# Patient Record
Sex: Female | Born: 1995 | Race: White | Hispanic: No | Marital: Married | Smoking: Never smoker
Health system: Southern US, Community
[De-identification: ages and names within clinical notes are randomized; demographics above are authoritative.]

## PROBLEM LIST (undated history)

## (undated) DIAGNOSIS — F419 Anxiety disorder, unspecified: Secondary | ICD-10-CM

## (undated) DIAGNOSIS — F429 Obsessive-compulsive disorder, unspecified: Secondary | ICD-10-CM

## (undated) DIAGNOSIS — K219 Gastro-esophageal reflux disease without esophagitis: Secondary | ICD-10-CM

## (undated) HISTORY — PX: WISDOM TOOTH EXTRACTION: SHX21

---
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
## 2017-08-31 ENCOUNTER — Encounter: Payer: Self-pay | Admitting: Family Medicine

## 2017-08-31 ENCOUNTER — Ambulatory Visit (INDEPENDENT_AMBULATORY_CARE_PROVIDER_SITE_OTHER): Payer: BLUE CROSS/BLUE SHIELD | Admitting: Family Medicine

## 2017-08-31 VITALS — BP 130/82 | HR 123 | Temp 98.5°F | Ht 61.0 in | Wt 209.8 lb

## 2017-08-31 DIAGNOSIS — F419 Anxiety disorder, unspecified: Secondary | ICD-10-CM | POA: Diagnosis not present

## 2017-08-31 DIAGNOSIS — F329 Major depressive disorder, single episode, unspecified: Secondary | ICD-10-CM | POA: Diagnosis not present

## 2017-08-31 DIAGNOSIS — Z7689 Persons encountering health services in other specified circumstances: Secondary | ICD-10-CM

## 2017-08-31 DIAGNOSIS — Z30011 Encounter for initial prescription of contraceptive pills: Secondary | ICD-10-CM | POA: Diagnosis not present

## 2017-08-31 DIAGNOSIS — F32A Depression, unspecified: Secondary | ICD-10-CM

## 2017-08-31 LAB — PREGNANCY, URINE: PREG TEST UR: NEGATIVE

## 2017-08-31 MED ORDER — NORGESTIMATE-ETH ESTRADIOL 0.25-35 MG-MCG PO TABS: 1.0000 | ORAL_TABLET | Freq: Every day | ORAL | 11 refills | Status: DC

## 2017-08-31 MED ORDER — FLUOXETINE HCL 10 MG PO CAPS: 10.0000 mg | ORAL_CAPSULE | Freq: Every day | ORAL | 0 refills | Status: DC

## 2017-08-31 NOTE — Patient Instructions (Addendum)
Fluoxetine capsules or tablets (Depression/Mood Disorders) What is this medicine? FLUOXETINE (floo OX e teen) belongs to a class of drugs known as selective serotonin reuptake inhibitors (SSRIs). It helps to treat mood problems such as depression, obsessive compulsive disorder, and panic attacks. It can also treat certain eating disorders. This medicine may be used for other purposes; ask your health care provider or pharmacist if you have questions. COMMON BRAND NAME(S): Prozac What should I tell my health care provider before I take this medicine? They need to know if you have any of these conditions: -bipolar disorder or a family history of bipolar disorder -bleeding disorders -glaucoma -heart disease -liver disease -low levels of sodium in the blood -seizures -suicidal thoughts, plans, or attempt; a previous suicide attempt by you or a family member -take MAOIs like Carbex, Eldepryl, Marplan, Nardil, and Parnate -take medicines that treat or prevent blood clots -thyroid disease -an unusual or allergic reaction to fluoxetine, other medicines, foods, dyes, or preservatives -pregnant or trying to get pregnant -breast-feeding How should I use this medicine? Take this medicine by mouth with a glass of water. Follow the directions on the prescription label. You can take this medicine with or without food. Take your medicine at regular intervals. Do not take it more often than directed. Do not stop taking this medicine suddenly except upon the advice of your doctor. Stopping this medicine too quickly may cause serious side effects or your condition may worsen. A special MedGuide will be given to you by the pharmacist with each prescription and refill. Be sure to read this information carefully each time. Talk to your pediatrician regarding the use of this medicine in children. While this drug may be prescribed for children as young as 7 years for selected conditions, precautions do  apply. Overdosage: If you think you have taken too much of this medicine contact a poison control center or emergency room at once. NOTE: This medicine is only for you. Do not share this medicine with others. What if I miss a dose? If you miss a dose, skip the missed dose and go back to your regular dosing schedule. Do not take double or extra doses. What may interact with this medicine? Do not take this medicine with any of the following medications: -other medicines containing fluoxetine, like Sarafem or Symbyax -cisapride -linezolid -MAOIs like Carbex, Eldepryl, Marplan, Nardil, and Parnate -methylene blue (injected into a vein) -pimozide -thioridazine This medicine may also interact with the following medications: -alcohol -amphetamines -aspirin and aspirin-like medicines -carbamazepine -certain medicines for depression, anxiety, or psychotic disturbances -certain medicines for migraine headaches like almotriptan, eletriptan, frovatriptan, naratriptan, rizatriptan, sumatriptan, zolmitriptan -digoxin -diuretics -fentanyl -flecainide -furazolidone -isoniazid -lithium -medicines for sleep -medicines that treat or prevent blood clots like warfarin, enoxaparin, and dalteparin -NSAIDs, medicines for pain and inflammation, like ibuprofen or naproxen -phenytoin -procarbazine -propafenone -rasagiline -ritonavir -supplements like St. John's wort, kava kava, valerian -tramadol -tryptophan -vinblastine This list may not describe all possible interactions. Give your health care provider a list of all the medicines, herbs, non-prescription drugs, or dietary supplements you use. Also tell them if you smoke, drink alcohol, or use illegal drugs. Some items may interact with your medicine. What should I watch for while using this medicine? Tell your doctor if your symptoms do not get better or if they get worse. Visit your doctor or health care professional for regular checks on your  progress. Because it may take several weeks to see the full effects of this medicine, it   is important to continue your treatment as prescribed by your doctor. Patients and their families should watch out for new or worsening thoughts of suicide or depression. Also watch out for sudden changes in feelings such as feeling anxious, agitated, panicky, irritable, hostile, aggressive, impulsive, severely restless, overly excited and hyperactive, or not being able to sleep. If this happens, especially at the beginning of treatment or after a change in dose, call your health care professional. Dennis Bast may get drowsy or dizzy. Do not drive, use machinery, or do anything that needs mental alertness until you know how this medicine affects you. Do not stand or sit up quickly, especially if you are an older patient. This reduces the risk of dizzy or fainting spells. Alcohol may interfere with the effect of this medicine. Avoid alcoholic drinks. Your mouth may get dry. Chewing sugarless gum or sucking hard candy, and drinking plenty of water may help. Contact your doctor if the problem does not go away or is severe. This medicine may affect blood sugar levels. If you have diabetes, check with your doctor or health care professional before you change your diet or the dose of your diabetic medicine. What side effects may I notice from receiving this medicine? Side effects that you should report to your doctor or health care professional as soon as possible: -allergic reactions like skin rash, itching or hives, swelling of the face, lips, or tongue -anxious -black, tarry stools -breathing problems -changes in vision -confusion -elevated mood, decreased need for sleep, racing thoughts, impulsive behavior -eye pain -fast, irregular heartbeat -feeling faint or lightheaded, falls -feeling agitated, angry, or irritable -hallucination, loss of contact with reality -loss of balance or coordination -loss of memory -painful  or prolonged erections -restlessness, pacing, inability to keep still -seizures -stiff muscles -suicidal thoughts or other mood changes -trouble sleeping -unusual bleeding or bruising -unusually weak or tired -vomiting Side effects that usually do not require medical attention (report to your doctor or health care professional if they continue or are bothersome): -change in appetite or weight -change in sex drive or performance -diarrhea -dry mouth -headache -increased sweating -nausea -tremors This list may not describe all possible side effects. Call your doctor for medical advice about side effects. You may report side effects to FDA at 1-800-FDA-1088. Where should I keep my medicine? Keep out of the reach of children. Store at room temperature between 15 and 30 degrees C (59 and 86 degrees F). Throw away any unused medicine after the expiration date. NOTE: This sheet is a summary. It may not cover all possible information. If you have questions about this medicine, talk to your doctor, pharmacist, or health care provider.  2018 Elsevier/Gold Standard (2016-03-25 15:55:27) Fluoxetine capsules or tablets (Depression/Mood Disorders) What is this medicine? FLUOXETINE (floo OX e teen) belongs to a class of drugs known as selective serotonin reuptake inhibitors (SSRIs). It helps to treat mood problems such as depression, obsessive compulsive disorder, and panic attacks. It can also treat certain eating disorders. This medicine may be used for other purposes; ask your health care provider or pharmacist if you have questions. COMMON BRAND NAME(S): Prozac What should I tell my health care provider before I take this medicine? They need to know if you have any of these conditions: -bipolar disorder or a family history of bipolar disorder -bleeding disorders -glaucoma -heart disease -liver disease -low levels of sodium in the blood -seizures -suicidal thoughts, plans, or attempt; a  previous suicide attempt by you or a family  member -take MAOIs like Carbex, Eldepryl, Marplan, Nardil, and Parnate -take medicines that treat or prevent blood clots -thyroid disease -an unusual or allergic reaction to fluoxetine, other medicines, foods, dyes, or preservatives -pregnant or trying to get pregnant -breast-feeding How should I use this medicine? Take this medicine by mouth with a glass of water. Follow the directions on the prescription label. You can take this medicine with or without food. Take your medicine at regular intervals. Do not take it more often than directed. Do not stop taking this medicine suddenly except upon the advice of your doctor. Stopping this medicine too quickly may cause serious side effects or your condition may worsen. A special MedGuide will be given to you by the pharmacist with each prescription and refill. Be sure to read this information carefully each time. Talk to your pediatrician regarding the use of this medicine in children. While this drug may be prescribed for children as young as 7 years for selected conditions, precautions do apply. Overdosage: If you think you have taken too much of this medicine contact a poison control center or emergency room at once. NOTE: This medicine is only for you. Do not share this medicine with others. What if I miss a dose? If you miss a dose, skip the missed dose and go back to your regular dosing schedule. Do not take double or extra doses. What may interact with this medicine? Do not take this medicine with any of the following medications: -other medicines containing fluoxetine, like Sarafem or Symbyax -cisapride -linezolid -MAOIs like Carbex, Eldepryl, Marplan, Nardil, and Parnate -methylene blue (injected into a vein) -pimozide -thioridazine This medicine may also interact with the following medications: -alcohol -amphetamines -aspirin and aspirin-like medicines -carbamazepine -certain medicines  for depression, anxiety, or psychotic disturbances -certain medicines for migraine headaches like almotriptan, eletriptan, frovatriptan, naratriptan, rizatriptan, sumatriptan, zolmitriptan -digoxin -diuretics -fentanyl -flecainide -furazolidone -isoniazid -lithium -medicines for sleep -medicines that treat or prevent blood clots like warfarin, enoxaparin, and dalteparin -NSAIDs, medicines for pain and inflammation, like ibuprofen or naproxen -phenytoin -procarbazine -propafenone -rasagiline -ritonavir -supplements like St. John's wort, kava kava, valerian -tramadol -tryptophan -vinblastine This list may not describe all possible interactions. Give your health care provider a list of all the medicines, herbs, non-prescription drugs, or dietary supplements you use. Also tell them if you smoke, drink alcohol, or use illegal drugs. Some items may interact with your medicine. What should I watch for while using this medicine? Tell your doctor if your symptoms do not get better or if they get worse. Visit your doctor or health care professional for regular checks on your progress. Because it may take several weeks to see the full effects of this medicine, it is important to continue your treatment as prescribed by your doctor. Patients and their families should watch out for new or worsening thoughts of suicide or depression. Also watch out for sudden changes in feelings such as feeling anxious, agitated, panicky, irritable, hostile, aggressive, impulsive, severely restless, overly excited and hyperactive, or not being able to sleep. If this happens, especially at the beginning of treatment or after a change in dose, call your health care professional. Dennis Bast may get drowsy or dizzy. Do not drive, use machinery, or do anything that needs mental alertness until you know how this medicine affects you. Do not stand or sit up quickly, especially if you are an older patient. This reduces the risk of dizzy  or fainting spells. Alcohol may interfere with the effect of this medicine.  Avoid alcoholic drinks. Your mouth may get dry. Chewing sugarless gum or sucking hard candy, and drinking plenty of water may help. Contact your doctor if the problem does not go away or is severe. This medicine may affect blood sugar levels. If you have diabetes, check with your doctor or health care professional before you change your diet or the dose of your diabetic medicine. What side effects may I notice from receiving this medicine? Side effects that you should report to your doctor or health care professional as soon as possible: -allergic reactions like skin rash, itching or hives, swelling of the face, lips, or tongue -anxious -black, tarry stools -breathing problems -changes in vision -confusion -elevated mood, decreased need for sleep, racing thoughts, impulsive behavior -eye pain -fast, irregular heartbeat -feeling faint or lightheaded, falls -feeling agitated, angry, or irritable -hallucination, loss of contact with reality -loss of balance or coordination -loss of memory -painful or prolonged erections -restlessness, pacing, inability to keep still -seizures -stiff muscles -suicidal thoughts or other mood changes -trouble sleeping -unusual bleeding or bruising -unusually weak or tired -vomiting Side effects that usually do not require medical attention (report to your doctor or health care professional if they continue or are bothersome): -change in appetite or weight -change in sex drive or performance -diarrhea -dry mouth -headache -increased sweating -nausea -tremors This list may not describe all possible side effects. Call your doctor for medical advice about side effects. You may report side effects to FDA at 1-800-FDA-1088. Where should I keep my medicine? Keep out of the reach of children. Store at room temperature between 15 and 30 degrees C (59 and 86 degrees F). Throw away any  unused medicine after the expiration date. NOTE: This sheet is a summary. It may not cover all possible information. If you have questions about this medicine, talk to your doctor, pharmacist, or health care provider.  2018 Elsevier/Gold Standard (2016-03-25 15:55:27)

## 2017-08-31 NOTE — Progress Notes (Signed)
BP 130/82 (BP Location: Left Arm, Patient Position: Sitting, Cuff Size: Normal)   Pulse (!) 123   Temp 98.5 F (36.9 C)   Ht 5\' 1"  (1.549 m)   Wt 209 lb 12.8 oz (95.2 kg)   LMP 07/21/2017   SpO2 100%   BMI 39.64 kg/m    Subjective:    Patient ID: Paige Williamson, female    DOB: 1996-10-16, 21 y.o.   MRN: [id number]  HPI: Paige Williamson is a 21 y.o. female  Chief Complaint  Patient presents with  . Establish Care  . Depression    Started college, has had symptoms since.  . Menstrual Problem   Patient presents today to establish care. Has several concerns today.   Started period at age 12, regular until 21 years old. Afterward, very irregular. Sometimes 28 days, sometimes 2 months between, varies month by month. Very light bleeding, no cramping. Now having abnormal hair growth on abdomen and face since then. Has never been on any contraceptives before. Not sexually active.    Pt currently in nursing school. Notes that she constantly worries and fixates on things to the point where she is throwing up regularly from stress. Has severe test anxiety that seems to be much worse now that she's in college. Also dealing with fatigue that she can't explain as she is sleeping well. No appetite. Has lost over 10 lb since starting school in August.   History reviewed. No pertinent past medical history.  Social History   Social History  . Marital status: Single    Spouse name: N/A  . Number of children: N/A  . Years of education: N/A   Occupational History  . Not on file.   Social History Main Topics  . Smoking status: Never Smoker  . Smokeless tobacco: Never Used  . Alcohol use No  . Drug use: No  . Sexual activity: Not on file   Other Topics Concern  . Not on file   Social History Narrative  . No narrative on file    Relevant past medical, surgical, family and social history reviewed and updated as indicated. Interim medical history since our last visit  reviewed. Allergies and medications reviewed and updated.  Review of Systems  Constitutional: Positive for appetite change and fatigue.  HENT: Negative.   Respiratory: Negative.   Cardiovascular: Negative.   Gastrointestinal: Negative.   Genitourinary: Positive for menstrual problem.  Musculoskeletal: Negative.   Neurological: Negative.   Psychiatric/Behavioral: Positive for sleep disturbance. The patient is nervous/anxious.    Per HPI unless specifically indicated above     Objective:    BP 130/82 (BP Location: Left Arm, Patient Position: Sitting, Cuff Size: Normal)   Pulse (!) 123   Temp 98.5 F (36.9 C)   Ht 5\' 1"  (1.549 m)   Wt 209 lb 12.8 oz (95.2 kg)   LMP 07/21/2017   SpO2 100%   BMI 39.64 kg/m   Wt Readings from Last 3 Encounters:  08/31/17 209 lb 12.8 oz (95.2 kg) (98 %, Z= 2.09)*   * Growth percentiles are based on CDC 2-20 Years data.    Physical Exam  Constitutional: She is oriented to person, place, and time. She appears well-developed and well-nourished. No distress.  HENT:  Head: Atraumatic.  Eyes: Pupils are equal, round, and reactive to light. Conjunctivae are normal. No scleral icterus.  Neck: Normal range of motion. Neck supple.  Cardiovascular: Normal rate, regular rhythm and normal heart sounds.   Pulmonary/Chest:  Effort normal and breath sounds normal. No respiratory distress.  Abdominal: Soft. Bowel sounds are normal. There is no tenderness.  Musculoskeletal: Normal range of motion.  Lymphadenopathy:    She has no cervical adenopathy.  Neurological: She is alert and oriented to person, place, and time. No cranial nerve deficit.  Skin: Skin is warm and dry.  Psychiatric: She has a normal mood and affect. Her behavior is normal.  Nursing note and vitals reviewed.  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 Will start low dose prozac, recommended she start working with school counselors regularly to develop coping strategies and such to deal with her test anxiety and stress management. Risks and benefits reviewed.       Relevant Medications   FLUoxetine (PROZAC) 10 MG capsule    Other Visit Diagnoses    Encounter to establish care       Encounter for initial prescription of contraceptive pills       Will monitor for cycle regulation. Side effects reviewed. Discussed importance of good lifestyle habits in keeping hormones regulated.   Relevant Medications   FLUoxetine (PROZAC) 10 MG capsule   norgestimate-ethinyl estradiol (ORTHO-CYCLEN, 28,) 0.25-35 MG-MCG tablet   Other Relevant Orders   Pregnancy, urine (Completed)      Follow up plan: Return in about 4 weeks (around 09/28/2017) for Depression f/u.

## 2017-09-03 DIAGNOSIS — F329 Major depressive disorder, single episode, unspecified: Secondary | ICD-10-CM | POA: Insufficient documentation

## 2017-09-03 DIAGNOSIS — F32A Depression, unspecified: Secondary | ICD-10-CM | POA: Insufficient documentation

## 2017-09-03 DIAGNOSIS — F419 Anxiety disorder, unspecified: Principal | ICD-10-CM

## 2017-09-03 NOTE — Assessment & Plan Note (Signed)
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 Will start low dose prozac, recommended she start working with school counselors regularly to develop coping strategies and such to deal with her test anxiety and stress management. Risks and benefits reviewed.

## 2017-09-26 ENCOUNTER — Telehealth: Payer: Self-pay | Admitting: Family Medicine

## 2017-09-26 DIAGNOSIS — Z30011 Encounter for initial prescription of contraceptive pills: Secondary | ICD-10-CM

## 2017-09-26 MED ORDER — FLUOXETINE HCL 10 MG PO CAPS: 10.0000 mg | ORAL_CAPSULE | Freq: Every day | ORAL | 0 refills | Status: DC

## 2017-09-26 NOTE — Telephone Encounter (Signed)
Patient needs refill sent to [facility] Paige Williamson   Fluoxetine HCL 10mg  cap 90 day supply  thanks

## 2017-09-26 NOTE — Telephone Encounter (Signed)
Rx sent 

## 2017-10-02 ENCOUNTER — Ambulatory Visit (INDEPENDENT_AMBULATORY_CARE_PROVIDER_SITE_OTHER): Payer: BLUE CROSS/BLUE SHIELD | Admitting: Family Medicine

## 2017-10-02 VITALS — BP 132/86 | HR 99 | Temp 98.3°F | Wt 212.3 lb

## 2017-10-02 DIAGNOSIS — F419 Anxiety disorder, unspecified: Secondary | ICD-10-CM | POA: Diagnosis not present

## 2017-10-02 DIAGNOSIS — F32A Depression, unspecified: Secondary | ICD-10-CM

## 2017-10-02 DIAGNOSIS — F329 Major depressive disorder, single episode, unspecified: Secondary | ICD-10-CM

## 2017-10-02 MED ORDER — FLUOXETINE HCL 20 MG PO TABS: 20.0000 mg | ORAL_TABLET | Freq: Every day | ORAL | 3 refills | Status: DC

## 2017-10-02 NOTE — Progress Notes (Signed)
   BP 132/86 (BP Location: Left Arm, Patient Position: Sitting, Cuff Size: Normal)   Pulse 99   Temp 98.3 F (36.8 C) (Oral)   Wt 212 lb 4.8 oz (96.3 kg)   SpO2 100%   BMI 40.11 kg/m    Subjective:    Patient ID: Paige Williamson, female    DOB: 1996/10/25, 21 y.o.   MRN: [id number]  HPI: Paige Williamson is a 21 y.o. female  Chief Complaint  Patient presents with  . Depression    Depression F/U. Patient states she doesn't notice a big change.    Patient presents today for 4 week f/u after starting low dose prozac. States she hasn't noticed a major life change, but notices improvements in small, subtle ways. No longer vomiting daily from stress, able to be more calm throughout the day and focused doing her assignments for college. Denies any side effects, SI/HI.   Relevant past medical, surgical, family and social history reviewed and updated as indicated. Interim medical history since our last visit reviewed. Allergies and medications reviewed and updated.  Review of Systems  Constitutional: Negative.   HENT: Negative.   Respiratory: Negative.   Cardiovascular: Negative.   Gastrointestinal: Negative.   Musculoskeletal: Negative.   Neurological: Negative.   Psychiatric/Behavioral: The patient is nervous/anxious.    Per HPI unless specifically indicated above     Objective:    BP 132/86 (BP Location: Left Arm, Patient Position: Sitting, Cuff Size: Normal)   Pulse 99   Temp 98.3 F (36.8 C) (Oral)   Wt 212 lb 4.8 oz (96.3 kg)   SpO2 100%   BMI 40.11 kg/m   Wt Readings from Last 3 Encounters:  10/02/17 212 lb 4.8 oz (96.3 kg) (98 %, Z= 2.12)*  08/31/17 209 lb 12.8 oz (95.2 kg) (98 %, Z= 2.09)*   * Growth percentiles are based on CDC (Girls, 2-20 Years) data.    Physical Exam  Constitutional: She is oriented to person, place, and time. She appears well-developed and well-nourished. No distress.  HENT:  Head: Atraumatic.  Eyes: Conjunctivae are normal. Pupils are  equal, round, and reactive to light.  Neck: Normal range of motion. Neck supple.  Cardiovascular: Normal rate and normal heart sounds.  Pulmonary/Chest: Effort normal and breath sounds normal. No respiratory distress.  Musculoskeletal: Normal range of motion.  Neurological: She is alert and oriented to person, place, and time.  Skin: Skin is warm and dry.  Psychiatric: She has a normal mood and affect. Her behavior is normal.  Nursing note and vitals reviewed.     Assessment & Plan:   Problem List Items Addressed This Visit      Other   Anxiety and depression - Primary    Some improvement on low dose prozac, will increase to 20 mg prozac and monitor closely for benefit. Reiterated importance of finding counseling to help as adjunct. Recheck in 1 month      Relevant Medications   FLUoxetine (PROZAC) 20 MG tablet       Follow up plan: Return in about 4 weeks (around 10/30/2017) for Anxiety and depression.

## 2017-10-03 ENCOUNTER — Telehealth: Payer: Self-pay

## 2017-10-03 NOTE — Telephone Encounter (Signed)
90 day supply request for Prozac 20mg 

## 2017-10-03 NOTE — Telephone Encounter (Signed)
Will not do 90 days as it is a new medication and we are testing this dose. If doing well next time will change to 90 days.

## 2017-10-04 ENCOUNTER — Encounter: Payer: Self-pay | Admitting: Family Medicine

## 2017-10-04 NOTE — Patient Instructions (Signed)
Follow up in 1 month   

## 2017-10-04 NOTE — Assessment & Plan Note (Signed)
Some improvement on low dose prozac, will increase to 20 mg prozac and monitor closely for benefit. Reiterated importance of finding counseling to help as adjunct. Recheck in 1 month

## 2017-10-04 NOTE — Telephone Encounter (Signed)
Pt.notified

## 2017-10-19 ENCOUNTER — Encounter: Payer: Self-pay | Admitting: Family Medicine

## 2017-10-22 ENCOUNTER — Other Ambulatory Visit: Payer: Self-pay | Admitting: Family Medicine

## 2017-10-22 MED ORDER — FLUOXETINE HCL 20 MG PO TABS: 20.0000 mg | ORAL_TABLET | Freq: Every day | ORAL | 1 refills | Status: DC

## 2017-10-26 ENCOUNTER — Other Ambulatory Visit: Payer: Self-pay | Admitting: Family Medicine

## 2017-10-26 DIAGNOSIS — Z30011 Encounter for initial prescription of contraceptive pills: Secondary | ICD-10-CM

## 2017-11-02 ENCOUNTER — Ambulatory Visit: Payer: BLUE CROSS/BLUE SHIELD | Admitting: Family Medicine

## 2018-03-12 ENCOUNTER — Encounter: Payer: Self-pay | Admitting: Family Medicine

## 2018-03-12 MED ORDER — PREDNISONE 10 MG PO TABS: ORAL_TABLET | ORAL | 0 refills | Status: DC

## 2018-05-15 ENCOUNTER — Ambulatory Visit (INDEPENDENT_AMBULATORY_CARE_PROVIDER_SITE_OTHER): Payer: Commercial Managed Care - PPO | Admitting: Family Medicine

## 2018-05-15 ENCOUNTER — Encounter: Payer: Self-pay | Admitting: Family Medicine

## 2018-05-15 VITALS — BP 131/86 | HR 99 | Temp 98.2°F | Ht 61.5 in | Wt 226.9 lb

## 2018-05-15 DIAGNOSIS — Z0001 Encounter for general adult medical examination with abnormal findings: Secondary | ICD-10-CM | POA: Diagnosis not present

## 2018-05-15 DIAGNOSIS — F32A Depression, unspecified: Secondary | ICD-10-CM

## 2018-05-15 DIAGNOSIS — Z0000 Encounter for general adult medical examination without abnormal findings: Secondary | ICD-10-CM

## 2018-05-15 DIAGNOSIS — Z30011 Encounter for initial prescription of contraceptive pills: Secondary | ICD-10-CM | POA: Diagnosis not present

## 2018-05-15 DIAGNOSIS — F329 Major depressive disorder, single episode, unspecified: Secondary | ICD-10-CM

## 2018-05-15 DIAGNOSIS — F419 Anxiety disorder, unspecified: Secondary | ICD-10-CM

## 2018-05-15 LAB — UA/M W/RFLX CULTURE, ROUTINE
BILIRUBIN UA: NEGATIVE
GLUCOSE, UA: NEGATIVE
Ketones, UA: NEGATIVE
Leukocytes, UA: NEGATIVE
NITRITE UA: NEGATIVE
Protein, UA: NEGATIVE
RBC, UA: NEGATIVE
Specific Gravity, UA: 1.025 (ref 1.005–1.030)
UUROB: 0.2 mg/dL (ref 0.2–1.0)
pH, UA: 5.5 (ref 5.0–7.5)

## 2018-05-15 MED ORDER — FLUOXETINE HCL 20 MG PO TABS: 20.0000 mg | ORAL_TABLET | Freq: Every day | ORAL | 3 refills | Status: DC

## 2018-05-15 MED ORDER — NORGESTIMATE-ETH ESTRADIOL 0.25-35 MG-MCG PO TABS: 1.0000 | ORAL_TABLET | Freq: Every day | ORAL | 11 refills | Status: DC

## 2018-05-15 NOTE — Patient Instructions (Signed)
Follow up in 1 year.

## 2018-05-15 NOTE — Progress Notes (Signed)
BP 131/86 (BP Location: Left Arm, Patient Position: Sitting, Cuff Size: Large)   Pulse 99   Temp 98.2 F (36.8 C) (Oral)   Ht 5' 1.5" (1.562 m)   Wt 226 lb 14.4 oz (102.9 kg)   SpO2 99%   BMI 42.18 kg/m    Subjective:    Patient ID: Paige Williamson, female    DOB: 1996-06-09, 22 y.o.   MRN: [id number]  HPI: Paige Williamson is a 22 y.o. female presenting on 05/15/2018 for comprehensive medical examination. Current medical complaints include:none  Cycles well regulated with ortho cyclen, no concerns with that.   Moods doing well with 20 mg prozac, notes significant improvement with this. No SI/HI, or side effects noted.   Depression Screen done today and results listed below:  Depression screen [facility] 2/9 08/31/2017  Decreased Interest 1  Down, Depressed, Hopeless 1  PHQ - 2 Score 2  Altered sleeping 2  Tired, decreased energy 2  Change in appetite 1  Feeling bad or failure about yourself  1  Trouble concentrating 0  Moving slowly or fidgety/restless 0  Suicidal thoughts 0  PHQ-9 Score 8    The patient does not have a history of falls. I did not complete a risk assessment for falls. A plan of care for falls was not documented.   Past Medical History:  No past medical history on file.  Surgical History:  Past Surgical History:  Procedure Laterality Date  . WISDOM TOOTH EXTRACTION      Medications:  No current outpatient medications on file prior to visit.   No current facility-administered medications on file prior to visit.     Allergies:  No Known Allergies  Social History:  Social History   Socioeconomic History  . Marital status: Single    Spouse name: Not on file  . Number of children: Not on file  . Years of education: Not on file  . Highest education level: Not on file  Occupational History  . Not on file  Social Needs  . Financial resource strain: Not on file  . Food insecurity:    Worry: Not on file    Inability: Not on file  .  Transportation needs:    Medical: Not on file    Non-medical: Not on file  Tobacco Use  . Smoking status: Never Smoker  . Smokeless tobacco: Never Used  Substance and Sexual Activity  . Alcohol use: No  . Drug use: No  . Sexual activity: Not on file  Lifestyle  . Physical activity:    Days per week: Not on file    Minutes per session: Not on file  . Stress: Not on file  Relationships  . Social connections:    Talks on phone: Not on file    Gets together: Not on file    Attends religious service: Not on file    Active member of club or organization: Not on file    Attends meetings of clubs or organizations: Not on file    Relationship status: Not on file  . Intimate partner violence:    Fear of current or ex partner: Not on file    Emotionally abused: Not on file    Physically abused: Not on file    Forced sexual activity: Not on file  Other Topics Concern  . Not on file  Social History Narrative  . Not on file   Social History   Tobacco Use  Smoking Status Never Smoker  Smokeless Tobacco Never Used   Social History   Substance and Sexual Activity  Alcohol Use No    Family History:  Family History  Problem Relation Age of Onset  . Depression Mother   . Hyperlipidemia Father   . Cancer Paternal Grandfather        Liver    Past medical history, surgical history, medications, allergies, family history and social history reviewed with patient today and changes made to appropriate areas of the chart.   Review of Systems - General ROS: negative Psychological ROS: negative Ophthalmic ROS: negative ENT ROS: negative Allergy and Immunology ROS: negative Respiratory ROS: no cough, shortness of breath, or wheezing Cardiovascular ROS: no chest pain or dyspnea on exertion Gastrointestinal ROS: no abdominal pain, change in bowel habits, or black or bloody stools Genito-Urinary ROS: no dysuria, trouble voiding, or hematuria Musculoskeletal ROS: negative Neurological  ROS: no TIA or stroke symptoms Dermatological ROS: negative All other ROS negative except what is listed above and in the HPI.      Objective:    BP 131/86 (BP Location: Left Arm, Patient Position: Sitting, Cuff Size: Large)   Pulse 99   Temp 98.2 F (36.8 C) (Oral)   Ht 5' 1.5" (1.562 m)   Wt 226 lb 14.4 oz (102.9 kg)   SpO2 99%   BMI 42.18 kg/m   Wt Readings from Last 3 Encounters:  05/15/18 226 lb 14.4 oz (102.9 kg) (99 %, Z= 2.29)*  10/02/17 212 lb 4.8 oz (96.3 kg) (98 %, Z= 2.12)*  08/31/17 209 lb 12.8 oz (95.2 kg) (98 %, Z= 2.09)*   * Growth percentiles are based on CDC (Girls, 2-20 Years) data.    Physical Exam  Constitutional: She is oriented to person, place, and time. She appears well-developed and well-nourished. No distress.  HENT:  Head: Atraumatic.  Right Ear: External ear normal.  Left Ear: External ear normal.  Nose: Nose normal.  Mouth/Throat: Oropharynx is clear and moist. No oropharyngeal exudate.  Eyes: Pupils are equal, round, and reactive to light. Conjunctivae are normal. No scleral icterus.  Neck: Normal range of motion. Neck supple. No thyromegaly present.  Cardiovascular: Normal rate, regular rhythm, normal heart sounds and intact distal pulses.  Pulmonary/Chest: Effort normal and breath sounds normal. No respiratory distress.  Abdominal: Soft. Bowel sounds are normal. She exhibits no mass. There is no tenderness.  Musculoskeletal: Normal range of motion. She exhibits no edema or tenderness.  Lymphadenopathy:    She has no cervical adenopathy.  Neurological: She is alert and oriented to person, place, and time. No cranial nerve deficit.  Skin: Skin is warm and dry. No rash noted.  Psychiatric: She has a normal mood and affect. Her behavior is normal.  Nursing note and vitals reviewed.   Results for orders placed or performed in visit on 08/31/17  Pregnancy, urine  Result Value Ref Range   Preg Test, Ur Negative Negative      Assessment &  Plan:   Problem List Items Addressed This Visit      Other   Anxiety and depression - Primary   Relevant Medications   FLUoxetine (PROZAC) 20 MG tablet    Other Visit Diagnoses    Encounter for initial prescription of contraceptive pills       Will monitor for cycle regulation. Side effects reviewed. Discussed importance of good lifestyle habits in keeping hormones regulated.   Relevant Medications   norgestimate-ethinyl estradiol (ORTHO-CYCLEN, 28,) 0.25-35 MG-MCG tablet   Annual physical exam  Relevant Orders   CBC with Differential/Platelet   Comprehensive metabolic panel   Lipid Panel w/o Chol/HDL Ratio   TSH   UA/M w/rflx Culture, Routine       Follow up plan: Return in about 1 year (around 05/16/2019) for CPE.   LABORATORY TESTING:  - Pap smear: not applicable  IMMUNIZATIONS:   - Tdap: Tetanus vaccination status reviewed: last tetanus booster within 10 years. - Influenza: Postponed to flu season  PATIENT COUNSELING:   Advised to take 1 mg of folate supplement per day if capable of pregnancy.   Sexuality: Discussed sexually transmitted diseases, partner selection, use of condoms, avoidance of unintended pregnancy  and contraceptive alternatives.   Advised to avoid cigarette smoking.  I discussed with the patient that most people either abstain from alcohol or drink within safe limits (<=14/week and <=4 drinks/occasion for males, <=7/weeks and <= 3 drinks/occasion for females) and that the risk for alcohol disorders and other health effects rises proportionally with the number of drinks per week and how often a drinker exceeds daily limits.  Discussed cessation/primary prevention of drug use and availability of treatment for abuse.   Diet: Encouraged to adjust caloric intake to maintain  or achieve ideal body weight, to reduce intake of dietary saturated fat and total fat, to limit sodium intake by avoiding high sodium foods and not adding table salt, and to  maintain adequate dietary potassium and calcium preferably from fresh fruits, vegetables, and low-fat dairy products.    stressed the importance of regular exercise  Injury prevention: Discussed safety belts, safety helmets, smoke detector, smoking near bedding or upholstery.   Dental health: Discussed importance of regular tooth brushing, flossing, and dental visits.    NEXT PREVENTATIVE PHYSICAL DUE IN 1 YEAR. Return in about 1 year (around 05/16/2019) for CPE.

## 2018-05-16 LAB — CBC WITH DIFFERENTIAL/PLATELET
BASOS: 0 %
Basophils Absolute: 0 10*3/uL (ref 0.0–0.2)
EOS (ABSOLUTE): 0.2 10*3/uL (ref 0.0–0.4)
EOS: 2 %
HEMOGLOBIN: 13.1 g/dL (ref 11.1–15.9)
Hematocrit: 38.4 % (ref 34.0–46.6)
Immature Grans (Abs): 0 10*3/uL (ref 0.0–0.1)
Immature Granulocytes: 0 %
LYMPHS ABS: 2.7 10*3/uL (ref 0.7–3.1)
Lymphs: 27 %
MCH: 29.2 pg (ref 26.6–33.0)
MCHC: 34.1 g/dL (ref 31.5–35.7)
MCV: 86 fL (ref 79–97)
MONOS ABS: 0.8 10*3/uL (ref 0.1–0.9)
Monocytes: 8 %
Neutrophils Absolute: 6.4 10*3/uL (ref 1.4–7.0)
Neutrophils: 63 %
Platelets: 348 10*3/uL (ref 150–450)
RBC: 4.48 x10E6/uL (ref 3.77–5.28)
RDW: 13 % (ref 12.3–15.4)
WBC: 10.1 10*3/uL (ref 3.4–10.8)

## 2018-05-16 LAB — COMPREHENSIVE METABOLIC PANEL
ALBUMIN: 4.4 g/dL (ref 3.5–5.5)
ALK PHOS: 111 IU/L (ref 39–117)
ALT: 22 IU/L (ref 0–32)
AST: 21 IU/L (ref 0–40)
Albumin/Globulin Ratio: 1.6 (ref 1.2–2.2)
BILIRUBIN TOTAL: 0.3 mg/dL (ref 0.0–1.2)
BUN / CREAT RATIO: 12 (ref 9–23)
BUN: 8 mg/dL (ref 6–20)
CHLORIDE: 102 mmol/L (ref 96–106)
CO2: 20 mmol/L (ref 20–29)
Calcium: 9.2 mg/dL (ref 8.7–10.2)
Creatinine, Ser: 0.69 mg/dL (ref 0.57–1.00)
GFR calc non Af Amer: 127 mL/min/{1.73_m2} (ref >59)
GFR, EST AFRICAN AMERICAN: 146 mL/min/{1.73_m2} (ref >59)
GLOBULIN, TOTAL: 2.7 g/dL (ref 1.5–4.5)
GLUCOSE: 82 mg/dL (ref 65–99)
POTASSIUM: 3.9 mmol/L (ref 3.5–5.2)
Sodium: 139 mmol/L (ref 134–144)
Total Protein: 7.1 g/dL (ref 6.0–8.5)

## 2018-05-16 LAB — TSH: TSH: 4.2 u[IU]/mL (ref 0.450–4.500)

## 2018-05-16 LAB — LIPID PANEL W/O CHOL/HDL RATIO
CHOLESTEROL TOTAL: 139 mg/dL (ref 100–169)
HDL: 31 mg/dL — ABNORMAL LOW (ref >39)
LDL Calculated: 63 mg/dL (ref 0–109)
Triglycerides: 227 mg/dL — ABNORMAL HIGH (ref 0–89)
VLDL Cholesterol Cal: 45 mg/dL — ABNORMAL HIGH (ref 5–40)

## 2019-07-25 ENCOUNTER — Telehealth: Payer: Self-pay | Admitting: Family Medicine

## 2019-07-25 NOTE — Telephone Encounter (Signed)
error 

## 2019-08-01 ENCOUNTER — Ambulatory Visit (INDEPENDENT_AMBULATORY_CARE_PROVIDER_SITE_OTHER): Payer: Commercial Managed Care - PPO | Admitting: Family Medicine

## 2019-08-01 ENCOUNTER — Other Ambulatory Visit: Payer: Self-pay

## 2019-08-01 ENCOUNTER — Encounter: Payer: Self-pay | Admitting: Family Medicine

## 2019-08-01 VITALS — BP 115/82 | HR 103 | Temp 98.5°F | Ht 61.0 in | Wt 241.2 lb

## 2019-08-01 DIAGNOSIS — Z0000 Encounter for general adult medical examination without abnormal findings: Secondary | ICD-10-CM | POA: Diagnosis not present

## 2019-08-01 DIAGNOSIS — Z3041 Encounter for surveillance of contraceptive pills: Secondary | ICD-10-CM

## 2019-08-01 MED ORDER — NORGESTIMATE-ETH ESTRADIOL 0.25-35 MG-MCG PO TABS: 1.0000 | ORAL_TABLET | Freq: Every day | ORAL | 11 refills | Status: DC

## 2019-08-01 NOTE — Progress Notes (Signed)
.   BP 115/82 (BP Location: Left Arm, Patient Position: Sitting, Cuff Size: Normal)   Pulse (!) 103   Temp 98.5 F (36.9 C) (Oral)   Ht 5\' 1"  (1.549 m)   Wt 241 lb 3.2 oz (109.4 kg)   SpO2 100%   BMI 45.57 kg/m    Subjective:    Patient ID: , female    DOB: February 17, 1996, 22 y.o.   MRN: [id number]  HPI: Paige Williamson is a 23 y.o. female presenting on 08/01/2019 for comprehensive medical examination. Current medical complaints include:none  She currently lives with: Menopausal Symptoms: no  Depression Screen done today and results listed below:  Depression screen [facility] 2/9 08/31/2017  Decreased Interest 1  Down, Depressed, Hopeless 1  PHQ - 2 Score 2  Altered sleeping 2  Tired, decreased energy 2  Change in appetite 1  Feeling bad or failure about yourself  1  Trouble concentrating 0  Moving slowly or fidgety/restless 0  Suicidal thoughts 0  PHQ-9 Score 8    The patient does not have a history of falls. I did not complete a risk assessment for falls. A plan of care for falls was not documented.   Past Medical History:  No past medical history on file.  Surgical History:  Past Surgical History:  Procedure Laterality Date  . WISDOM TOOTH EXTRACTION      Medications:  No current outpatient medications on file prior to visit.   No current facility-administered medications on file prior to visit.     Allergies:  No Known Allergies  Social History:  Social History   Socioeconomic History  . Marital status: Single    Spouse name: Not on file  . Number of children: Not on file  . Years of education: Not on file  . Highest education level: Not on file  Occupational History  . Not on file  Social Needs  . Financial resource strain: Not on file  . Food insecurity    Worry: Not on file    Inability: Not on file  . Transportation needs    Medical: Not on file    Non-medical: Not on file  Tobacco Use  . Smoking status: Never Smoker  . Smokeless  tobacco: Never Used  Substance and Sexual Activity  . Alcohol use: No  . Drug use: No  . Sexual activity: Not on file  Lifestyle  . Physical activity    Days per week: Not on file    Minutes per session: Not on file  . Stress: Not on file  Relationships  . Social 09/02/2017 on phone: Not on file    Gets together: Not on file    Attends religious service: Not on file    Active member of club or organization: Not on file    Attends meetings of clubs or organizations: Not on file    Relationship status: Not on file  . Intimate partner violence    Fear of current or ex partner: Not on file    Emotionally abused: Not on file    Physically abused: Not on file    Forced sexual activity: Not on file  Other Topics Concern  . Not on file  Social History Narrative  . Not on file   Social History   Tobacco Use  Smoking Status Never Smoker  Smokeless Tobacco Never Used   Social History   Substance and Sexual Activity  Alcohol Use No  Family History:  Family History  Problem Relation Age of Onset  . Depression Mother   . Hyperlipidemia Father   . Cancer Paternal Grandfather        Liver    Past medical history, surgical history, medications, allergies, family history and social history reviewed with patient today and changes made to appropriate areas of the chart.   Review of Systems - General ROS: negative Psychological ROS: negative Ophthalmic ROS: negative ENT ROS: negative Allergy and Immunology ROS: negative Hematological and Lymphatic ROS: negative Endocrine ROS: negative Breast ROS: negative for breast lumps Respiratory ROS: no cough, shortness of breath, or wheezing Cardiovascular ROS: no chest pain or dyspnea on exertion Gastrointestinal ROS: no abdominal pain, change in bowel habits, or black or bloody stools Genito-Urinary ROS: no dysuria, trouble voiding, or hematuria Musculoskeletal ROS: negative Neurological ROS: no TIA or stroke symptoms  Dermatological ROS: negative All other ROS negative except what is listed above and in the HPI.      Objective:    BP 115/82 (BP Location: Left Arm, Patient Position: Sitting, Cuff Size: Normal)   Pulse (!) 103   Temp 98.5 F (36.9 C) (Oral)   Ht 5\' 1"  (1.549 m)   Wt 241 lb 3.2 oz (109.4 kg)   SpO2 100%   BMI 45.57 kg/m   Wt Readings from Last 3 Encounters:  08/01/19 241 lb 3.2 oz (109.4 kg)  05/15/18 226 lb 14.4 oz (102.9 kg) (99 %, Z= 2.29)*  10/02/17 212 lb 4.8 oz (96.3 kg) (98 %, Z= 2.12)*   * Growth percentiles are based on CDC (Girls, 2-20 Years) data.    Physical Exam Vitals signs and nursing note reviewed.  Constitutional:      General: She is not in acute distress.    Appearance: She is well-developed.  HENT:     Head: Atraumatic.     Right Ear: External ear normal.     Left Ear: External ear normal.     Nose: Nose normal.     Mouth/Throat:     Pharynx: No oropharyngeal exudate.  Eyes:     General: No scleral icterus.    Conjunctiva/sclera: Conjunctivae normal.     Pupils: Pupils are equal, round, and reactive to light.  Neck:     Musculoskeletal: Normal range of motion and neck supple.     Thyroid: No thyromegaly.  Cardiovascular:     Rate and Rhythm: Normal rate and regular rhythm.     Heart sounds: Normal heart sounds.  Pulmonary:     Effort: Pulmonary effort is normal. No respiratory distress.     Breath sounds: Normal breath sounds.  Abdominal:     General: Bowel sounds are normal.     Palpations: Abdomen is soft. There is no mass.     Tenderness: There is no abdominal tenderness.  Genitourinary:    Comments: Declines GU exam Musculoskeletal: Normal range of motion.        General: No tenderness.  Lymphadenopathy:     Cervical: No cervical adenopathy.  Skin:    General: Skin is warm and dry.     Findings: No rash.  Neurological:     Mental Status: She is alert and oriented to person, place, and time.     Cranial Nerves: No cranial nerve  deficit.  Psychiatric:        Behavior: Behavior normal.     Results for orders placed or performed in visit on 08/01/19  Microscopic Examination   URINE  Result  Value Ref Range   WBC, UA 6-10 (A) 0 - 5 /hpf   RBC 0-2 0 - 2 /hpf   Epithelial Cells (non renal) 0-10 0 - 10 /hpf   Bacteria, UA Moderate (A) None seen/Few  Urine Culture, Reflex   URINE  Result Value Ref Range   Urine Culture, Routine Preliminary report (A)    Organism ID, Bacteria Gram negative rods (A)    ORGANISM ID, BACTERIA Comment   CBC with Differential/Platelet  Result Value Ref Range   WBC 10.6 3.4 - 10.8 x10E3/uL   RBC 4.70 3.77 - 5.28 x10E6/uL   Hemoglobin 14.0 11.1 - 15.9 g/dL   Hematocrit 40.5 34.0 - 46.6 %   MCV 86 79 - 97 fL   MCH 29.8 26.6 - 33.0 pg   MCHC 34.6 31.5 - 35.7 g/dL   RDW 12.6 11.7 - 15.4 %   Platelets 366 150 - 450 x10E3/uL   Neutrophils 70 Not Estab. %   Lymphs 20 Not Estab. %   Monocytes 7 Not Estab. %   Eos 2 Not Estab. %   Basos 1 Not Estab. %   Neutrophils Absolute 7.5 (H) 1.4 - 7.0 x10E3/uL   Lymphocytes Absolute 2.2 0.7 - 3.1 x10E3/uL   Monocytes Absolute 0.7 0.1 - 0.9 x10E3/uL   EOS (ABSOLUTE) 0.2 0.0 - 0.4 x10E3/uL   Basophils Absolute 0.1 0.0 - 0.2 x10E3/uL   Immature Granulocytes 0 Not Estab. %   Immature Grans (Abs) 0.0 0.0 - 0.1 x10E3/uL  Comprehensive metabolic panel  Result Value Ref Range   Glucose 88 65 - 99 mg/dL   BUN 9 6 - 20 mg/dL   Creatinine, Ser 0.69 0.57 - 1.00 mg/dL   GFR calc non Af Amer 126 >59 mL/min/1.73   GFR calc Af Amer 145 >59 mL/min/1.73   BUN/Creatinine Ratio 13 9 - 23   Sodium 136 134 - 144 mmol/L   Potassium 4.2 3.5 - 5.2 mmol/L   Chloride 100 96 - 106 mmol/L   CO2 22 20 - 29 mmol/L   Calcium 9.6 8.7 - 10.2 mg/dL   Total Protein 7.2 6.0 - 8.5 g/dL   Albumin 4.2 3.9 - 5.0 g/dL   Globulin, Total 3.0 1.5 - 4.5 g/dL   Albumin/Globulin Ratio 1.4 1.2 - 2.2   Bilirubin Total 0.3 0.0 - 1.2 mg/dL   Alkaline Phosphatase 166 (H) 39 - 117  IU/L   AST 35 0 - 40 IU/L   ALT 57 (H) 0 - 32 IU/L  Lipid Panel w/o Chol/HDL Ratio  Result Value Ref Range   Cholesterol, Total 170 100 - 199 mg/dL   Triglycerides 342 (H) 0 - 149 mg/dL   HDL 25 (L) >39 mg/dL   VLDL Cholesterol Cal 57 (H) 5 - 40 mg/dL   LDL Chol Calc (NIH) 88 0 - 99 mg/dL  TSH  Result Value Ref Range   TSH 1.830 0.450 - 4.500 uIU/mL  UA/M w/rflx Culture, Routine   Specimen: Urine   URINE  Result Value Ref Range   Specific Gravity, UA 1.020 1.005 - 1.030   pH, UA 6.0 5.0 - 7.5   Color, UA Yellow Yellow   Appearance Ur Clear Clear   Leukocytes,UA 1+ (A) Negative   Protein,UA Negative Negative/Trace   Glucose, UA Negative Negative   Ketones, UA Negative Negative   RBC, UA Trace (A) Negative   Bilirubin, UA Negative Negative   Urobilinogen, Ur 0.2 0.2 - 1.0 mg/dL   Nitrite, UA Negative Negative  Microscopic Examination See below:    Urinalysis Reflex Comment       Assessment & Plan:   Problem List Items Addressed This Visit    None    Visit Diagnoses    Annual physical exam    -  Primary   Relevant Orders   CBC with Differential/Platelet (Completed)   Comprehensive metabolic panel (Completed)   Lipid Panel w/o Chol/HDL Ratio (Completed)   TSH (Completed)   UA/M w/rflx Culture, Routine (Completed)   Encounter for surveillance of contraceptive pills       Will monitor for cycle regulation. Side effects reviewed. Discussed importance of good lifestyle habits in keeping hormones regulated.   Relevant Medications   norgestimate-ethinyl estradiol (ORTHO-CYCLEN, 28,) 0.25-35 MG-MCG tablet       Follow up plan: Return in about 1 year (around 07/31/2020) for CPE.   LABORATORY TESTING:  - Pap smear: not applicable  IMMUNIZATIONS:   - Tdap: Tetanus vaccination status reviewed: last tetanus booster within 10 years. - Influenza: Refused  PATIENT COUNSELING:   Advised to take 1 mg of folate supplement per day if capable of pregnancy.   Sexuality:  Discussed sexually transmitted diseases, partner selection, use of condoms, avoidance of unintended pregnancy  and contraceptive alternatives.   Advised to avoid cigarette smoking.  I discussed with the patient that most people either abstain from alcohol or drink within safe limits (<=14/week and <=4 drinks/occasion for males, <=7/weeks and <= 3 drinks/occasion for females) and that the risk for alcohol disorders and other health effects rises proportionally with the number of drinks per week and how often a drinker exceeds daily limits.  Discussed cessation/primary prevention of drug use and availability of treatment for abuse.   Diet: Encouraged to adjust caloric intake to maintain  or achieve ideal body weight, to reduce intake of dietary saturated fat and total fat, to limit sodium intake by avoiding high sodium foods and not adding table salt, and to maintain adequate dietary potassium and calcium preferably from fresh fruits, vegetables, and low-fat dairy products.    stressed the importance of regular exercise  Injury prevention: Discussed safety belts, safety helmets, smoke detector, smoking near bedding or upholstery.   Dental health: Discussed importance of regular tooth brushing, flossing, and dental visits.    NEXT PREVENTATIVE PHYSICAL DUE IN 1 YEAR. Return in about 1 year (around 07/31/2020) for CPE.

## 2019-08-02 LAB — CBC WITH DIFFERENTIAL/PLATELET
Basophils Absolute: 0.1 10*3/uL (ref 0.0–0.2)
Basos: 1 %
EOS (ABSOLUTE): 0.2 10*3/uL (ref 0.0–0.4)
Eos: 2 %
Hematocrit: 40.5 % (ref 34.0–46.6)
Hemoglobin: 14 g/dL (ref 11.1–15.9)
Immature Grans (Abs): 0 10*3/uL (ref 0.0–0.1)
Immature Granulocytes: 0 %
Lymphocytes Absolute: 2.2 10*3/uL (ref 0.7–3.1)
Lymphs: 20 %
MCH: 29.8 pg (ref 26.6–33.0)
MCHC: 34.6 g/dL (ref 31.5–35.7)
MCV: 86 fL (ref 79–97)
Monocytes Absolute: 0.7 10*3/uL (ref 0.1–0.9)
Monocytes: 7 %
Neutrophils Absolute: 7.5 10*3/uL — ABNORMAL HIGH (ref 1.4–7.0)
Neutrophils: 70 %
Platelets: 366 10*3/uL (ref 150–450)
RBC: 4.7 x10E6/uL (ref 3.77–5.28)
RDW: 12.6 % (ref 11.7–15.4)
WBC: 10.6 10*3/uL (ref 3.4–10.8)

## 2019-08-02 LAB — LIPID PANEL W/O CHOL/HDL RATIO
Cholesterol, Total: 170 mg/dL (ref 100–199)
HDL: 25 mg/dL — ABNORMAL LOW (ref >39)
LDL Chol Calc (NIH): 88 mg/dL (ref 0–99)
Triglycerides: 342 mg/dL — ABNORMAL HIGH (ref 0–149)
VLDL Cholesterol Cal: 57 mg/dL — ABNORMAL HIGH (ref 5–40)

## 2019-08-02 LAB — COMPREHENSIVE METABOLIC PANEL
ALT: 57 IU/L — ABNORMAL HIGH (ref 0–32)
AST: 35 IU/L (ref 0–40)
Albumin/Globulin Ratio: 1.4 (ref 1.2–2.2)
Albumin: 4.2 g/dL (ref 3.9–5.0)
Alkaline Phosphatase: 166 IU/L — ABNORMAL HIGH (ref 39–117)
BUN/Creatinine Ratio: 13 (ref 9–23)
BUN: 9 mg/dL (ref 6–20)
Bilirubin Total: 0.3 mg/dL (ref 0.0–1.2)
CO2: 22 mmol/L (ref 20–29)
Calcium: 9.6 mg/dL (ref 8.7–10.2)
Chloride: 100 mmol/L (ref 96–106)
Creatinine, Ser: 0.69 mg/dL (ref 0.57–1.00)
GFR calc Af Amer: 145 mL/min/{1.73_m2} (ref >59)
GFR calc non Af Amer: 126 mL/min/{1.73_m2} (ref >59)
Globulin, Total: 3 g/dL (ref 1.5–4.5)
Glucose: 88 mg/dL (ref 65–99)
Potassium: 4.2 mmol/L (ref 3.5–5.2)
Sodium: 136 mmol/L (ref 134–144)
Total Protein: 7.2 g/dL (ref 6.0–8.5)

## 2019-08-02 LAB — TSH: TSH: 1.83 u[IU]/mL (ref 0.450–4.500)

## 2019-08-05 LAB — UA/M W/RFLX CULTURE, ROUTINE
Bilirubin, UA: NEGATIVE
Glucose, UA: NEGATIVE
Ketones, UA: NEGATIVE
Nitrite, UA: NEGATIVE
Protein,UA: NEGATIVE
Specific Gravity, UA: 1.02 (ref 1.005–1.030)
Urobilinogen, Ur: 0.2 mg/dL (ref 0.2–1.0)
pH, UA: 6 (ref 5.0–7.5)

## 2019-08-05 LAB — URINE CULTURE, REFLEX

## 2019-08-05 LAB — MICROSCOPIC EXAMINATION

## 2019-08-11 ENCOUNTER — Other Ambulatory Visit: Payer: Self-pay | Admitting: Family Medicine

## 2019-08-11 MED ORDER — SULFAMETHOXAZOLE-TRIMETHOPRIM 800-160 MG PO TABS: 1.0000 | ORAL_TABLET | Freq: Two times a day (BID) | ORAL | 0 refills | Status: DC

## 2019-09-22 ENCOUNTER — Encounter: Payer: Self-pay | Admitting: Family Medicine

## 2019-09-26 ENCOUNTER — Telehealth: Payer: Self-pay | Admitting: *Deleted

## 2019-09-26 ENCOUNTER — Encounter: Payer: Self-pay | Admitting: Family Medicine

## 2019-09-26 NOTE — Telephone Encounter (Signed)
Copied from [place] [id number]. Topic: General - Call Back - No Documentation >> Sep 26, 2019  3:17 PM Paige Williamson wrote: Reason for CRM: Pt is requesting oral antifungal, pt has fungal infection underneath her nails on both hands. Scheduled an OV for Monday with PCP. She is requesting a clinical call back

## 2019-09-26 NOTE — Telephone Encounter (Signed)
Needs appt

## 2019-09-29 ENCOUNTER — Encounter: Payer: Self-pay | Admitting: Family Medicine

## 2019-09-29 ENCOUNTER — Ambulatory Visit: Payer: Commercial Managed Care - PPO | Admitting: Family Medicine

## 2019-09-29 ENCOUNTER — Other Ambulatory Visit: Payer: Self-pay

## 2019-09-29 VITALS — BP 125/84 | HR 96 | Temp 98.3°F | Ht 61.0 in | Wt 236.0 lb

## 2019-09-29 DIAGNOSIS — L309 Dermatitis, unspecified: Secondary | ICD-10-CM | POA: Diagnosis not present

## 2019-09-29 MED ORDER — PREDNISONE 10 MG PO TABS: 10.0000 mg | ORAL_TABLET | Freq: Every day | ORAL | 0 refills | Status: DC

## 2019-09-29 MED ORDER — SULFAMETHOXAZOLE-TRIMETHOPRIM 800-160 MG PO TABS: 1.0000 | ORAL_TABLET | Freq: Two times a day (BID) | ORAL | 0 refills | Status: DC

## 2019-09-29 NOTE — Progress Notes (Signed)
BP 125/84   Pulse 96   Temp 98.3 F (36.8 C) (Oral)   Ht 5\' 1"  (1.549 m)   Wt 236 lb (107 kg)   SpO2 95%   BMI 44.59 kg/m    Subjective:    Patient ID: , female    DOB: January 13, 1996, 22 y.o.   MRN: [id number]  HPI: Paige Williamson is a 23 y.o. female  Chief Complaint  Patient presents with  . Hand Pain    bilateral finger nails red, swelling/blisters like x over a month. pt did teledoctor. given cephalexin 500 mg cap and mupirocin ointment 2%. went away for a bit but came back   Patient presenting today with blistering, redness and swelling of all cuticles of fingers. This started about a month ago, seemed to somewhat go away after a course of keflex but came back after getting a manicure about a week ago and is now significant and painful. So far has tried topical antibiotic ointment, antifungal cream, and the keflex from [facility]ed[facility] without much relief. Denies fevers, chills, new products tried.   Relevant past medical, surgical, family and social history reviewed and updated as indicated. Interim medical history since our last visit reviewed. Allergies and medications reviewed and updated.  Review of Systems  Per HPI unless specifically indicated above     Objective:    BP 125/84   Pulse 96   Temp 98.3 F (36.8 C) (Oral)   Ht 5\' 1"  (1.549 m)   Wt 236 lb (107 kg)   SpO2 95%   BMI 44.59 kg/m   Wt Readings from Last 3 Encounters:  09/29/19 236 lb (107 kg)  08/01/19 241 lb 3.2 oz (109.4 kg)  05/15/18 226 lb 14.4 oz (102.9 kg) (99 %, Z= 2.29)*   * Growth percentiles are based on CDC (Girls, 2-20 Years) data.    Physical Exam Vitals signs and nursing note reviewed.  Constitutional:      Appearance: Normal appearance. She is not ill-appearing.  HENT:     Head: Atraumatic.  Eyes:     Extraocular Movements: Extraocular movements intact.     Conjunctiva/sclera: Conjunctivae normal.  Neck:     Musculoskeletal: Normal range of motion and neck supple.   Cardiovascular:     Rate and Rhythm: Normal rate and regular rhythm.     Heart sounds: Normal heart sounds.  Pulmonary:     Effort: Pulmonary effort is normal.     Breath sounds: Normal breath sounds.  Musculoskeletal: Normal range of motion.  Skin:    General: Skin is warm.     Comments: Blistering edema and erythema on all 10 cuticles b/l hands  Neurological:     Mental Status: She is alert and oriented to person, place, and time.  Psychiatric:        Mood and Affect: Mood normal.        Thought Content: Thought content normal.        Judgment: Judgment normal.     Results for orders placed or performed in visit on 08/01/19  Microscopic Examination   URINE  Result Value Ref Range   WBC, UA 6-10 (A) 0 - 5 /hpf   RBC 0-2 0 - 2 /hpf   Epithelial Cells (non renal) 0-10 0 - 10 /hpf   Bacteria, UA Moderate (A) None seen/Few  Urine Culture, Reflex   URINE  Result Value Ref Range   Urine Culture, Routine Final report (A)    Organism ID, Bacteria  Proteus mirabilis (A)    ORGANISM ID, BACTERIA Comment    Antimicrobial Susceptibility Comment   CBC with Differential/Platelet  Result Value Ref Range   WBC 10.6 3.4 - 10.8 x10E3/uL   RBC 4.70 3.77 - 5.28 x10E6/uL   Hemoglobin 14.0 11.1 - 15.9 g/dL   Hematocrit 40.5 34.0 - 46.6 %   MCV 86 79 - 97 fL   MCH 29.8 26.6 - 33.0 pg   MCHC 34.6 31.5 - 35.7 g/dL   RDW 12.6 11.7 - 15.4 %   Platelets 366 150 - 450 x10E3/uL   Neutrophils 70 Not Estab. %   Lymphs 20 Not Estab. %   Monocytes 7 Not Estab. %   Eos 2 Not Estab. %   Basos 1 Not Estab. %   Neutrophils Absolute 7.5 (H) 1.4 - 7.0 x10E3/uL   Lymphocytes Absolute 2.2 0.7 - 3.1 x10E3/uL   Monocytes Absolute 0.7 0.1 - 0.9 x10E3/uL   EOS (ABSOLUTE) 0.2 0.0 - 0.4 x10E3/uL   Basophils Absolute 0.1 0.0 - 0.2 x10E3/uL   Immature Granulocytes 0 Not Estab. %   Immature Grans (Abs) 0.0 0.0 - 0.1 x10E3/uL  Comprehensive metabolic panel  Result Value Ref Range   Glucose 88 65 - 99 mg/dL    BUN 9 6 - 20 mg/dL   Creatinine, Ser 0.69 0.57 - 1.00 mg/dL   GFR calc non Af Amer 126 >59 mL/min/1.73   GFR calc Af Amer 145 >59 mL/min/1.73   BUN/Creatinine Ratio 13 9 - 23   Sodium 136 134 - 144 mmol/L   Potassium 4.2 3.5 - 5.2 mmol/L   Chloride 100 96 - 106 mmol/L   CO2 22 20 - 29 mmol/L   Calcium 9.6 8.7 - 10.2 mg/dL   Total Protein 7.2 6.0 - 8.5 g/dL   Albumin 4.2 3.9 - 5.0 g/dL   Globulin, Total 3.0 1.5 - 4.5 g/dL   Albumin/Globulin Ratio 1.4 1.2 - 2.2   Bilirubin Total 0.3 0.0 - 1.2 mg/dL   Alkaline Phosphatase 166 (H) 39 - 117 IU/L   AST 35 0 - 40 IU/L   ALT 57 (H) 0 - 32 IU/L  Lipid Panel w/o Chol/HDL Ratio  Result Value Ref Range   Cholesterol, Total 170 100 - 199 mg/dL   Triglycerides 342 (H) 0 - 149 mg/dL   HDL 25 (L) >39 mg/dL   VLDL Cholesterol Cal 57 (H) 5 - 40 mg/dL   LDL Chol Calc (NIH) 88 0 - 99 mg/dL  TSH  Result Value Ref Range   TSH 1.830 0.450 - 4.500 uIU/mL  UA/M w/rflx Culture, Routine   Specimen: Urine   URINE  Result Value Ref Range   Specific Gravity, UA 1.020 1.005 - 1.030   pH, UA 6.0 5.0 - 7.5   Color, UA Yellow Yellow   Appearance Ur Clear Clear   Leukocytes,UA 1+ (A) Negative   Protein,UA Negative Negative/Trace   Glucose, UA Negative Negative   Ketones, UA Negative Negative   RBC, UA Trace (A) Negative   Bilirubin, UA Negative Negative   Urobilinogen, Ur 0.2 0.2 - 1.0 mg/dL   Nitrite, UA Negative Negative   Microscopic Examination See below:    Urinalysis Reflex Comment       Assessment & Plan:   Problem List Items Addressed This Visit    None    Visit Diagnoses    Hand dermatitis    -  Primary   Does not appear fungal or bacterial. Suspect allergic or inflammatory.  Will avoid irritants, start benadryl cream and prednisone. F/u if not resolving       Follow up plan: Return if symptoms worsen or fail to improve.

## 2019-09-30 ENCOUNTER — Encounter: Payer: Self-pay | Admitting: Family Medicine

## 2022-02-15 ENCOUNTER — Encounter: Payer: Commercial Managed Care - PPO | Admitting: Obstetrics and Gynecology

## 2022-02-15 NOTE — Progress Notes (Deleted)
? ? ?  GYNECOLOGY PROGRESS NOTE ? ?Subjective:  ? ? Patient ID: Paige Williamson, female    DOB: 1996-06-24, 26 y.o.   MRN: [id number] ? ?HPI ? Patient is a 26 y.o. No obstetric history on file. female who presents for  ? ?{Common ambulatory SmartLinks:19316} ? ?Review of Systems ?{ros; complete:30496}  ? ?Objective:  ? There were no vitals taken for this visit. There is no height or weight on file to calculate BMI. ?General appearance: {general exam:16600} ?Abdomen: {abdominal exam:16834} ?Pelvic: {pelvic exam:16852::"cervix normal in appearance","external genitalia normal","no adnexal masses or tenderness","no cervical motion tenderness","rectovaginal septum normal","uterus normal size, shape, and consistency","vagina normal without discharge"} ?Extremities: {extremity exam:5109} ?Neurologic: {neuro exam:17854} ? ? ?Assessment:  ? ?No diagnosis found.  ? ?Plan:  ? ?There are no diagnoses linked to this encounter.  ?

## 2022-05-03 DIAGNOSIS — Z3490 Encounter for supervision of normal pregnancy, unspecified, unspecified trimester: Secondary | ICD-10-CM | POA: Insufficient documentation

## 2022-05-26 NOTE — H&P (Signed)
History and Physical   SERVICE: Gynecology  Patient Name: Paige Williamson Patient MRN:   [id number]  CC: Missed abortion  HPI: Paige Williamson is a 26 y.o. G1P0 at approx 9wks with nonviable fetus desiring D&C  05/26/22 TVUS: Uterus anteverted Single, non viable IUP, S=[redacted]w[redacted]d Yolk sac and amnion seen No FHR seen Cervical length=4.59cm Rt corpus luteum cyst=1.4cm Left ovary not seen No free fluid seen  Rh O positive Minimal bleeding no cramping   Review of Systems: positives in bold GEN:   fevers, chills, weight changes, appetite changes, fatigue, night sweats HEENT:  HA, vision changes, hearing loss, congestion, rhinorrhea, sinus pressure, dysphagia CV:   CP, palpitations PULM:  SOB, cough GI:  abd pain, N/V/D/C GU:  dysuria, urgency, frequency MSK:  arthralgias, myalgias, back pain, swelling SKIN:  rashes, color changes, pallor NEURO:  numbness, weakness, tingling, seizures, dizziness, tremors PSYCH:  depression, anxiety, behavioral problems, confusion  HEME/LYMPH:  easy bruising or bleeding ENDO:  heat/cold intolerance  Past Obstetrical History: OB History   No obstetric history on file.     Past Gynecologic History: Patient's last menstrual period was 02/27/2022 (exact date).   Past Medical History: Past Medical History:  Diagnosis Date   Anxiety    GERD (gastroesophageal reflux disease)    OCD (obsessive compulsive disorder)     Past Surgical History:   Past Surgical History:  Procedure Laterality Date   WISDOM TOOTH EXTRACTION      Family History:  family history includes Cancer in her paternal grandfather; Depression in her mother; Hyperlipidemia in her father.  Social History:  Social History   Socioeconomic History   Marital status: Married    Spouse name: Greig Castilla   Number of children: 0   Years of education: Not on file   Highest education level: Not on file  Occupational History   Not on file  Tobacco Use   Smoking status: Never    Smokeless tobacco: Never  Vaping Use   Vaping Use: Never used  Substance and Sexual Activity   Alcohol use: Yes    Comment: socially   Drug use: No   Sexual activity: Yes  Other Topics Concern   Not on file  Social History Narrative   Not on file   Social Determinants of Health   Financial Resource Strain: Not on file  Food Insecurity: Not on file  Transportation Needs: Not on file  Physical Activity: Not on file  Stress: Not on file  Social Connections: Not on file  Intimate Partner Violence: Not on file    Home Medications:  Medications reconciled in EPIC  No current facility-administered medications on file prior to encounter.   No current outpatient medications on file prior to encounter.    Allergies:  No Known Allergies  Physical Exam:  Temp:  [98.5 F (36.9 C)] 98.5 F (36.9 C) (07/25 0936) Pulse Rate:  [97] 97 (07/25 0936) Resp:  [14] 14 (07/25 0936) BP: (162)/(90) 162/90 (07/25 0936) SpO2:  [100 %] 100 % (07/25 0936) Weight:  [111.6 kg] 111.6 kg (07/25 0936)   General Appearance:  Well developed, well nourished, no acute distress, alert and oriented x3 HEENT:  Normocephalic atraumatic, extraocular movements intact, moist mucous membranes Cardiovascular:  Normal S1/S2, regular rate and rhythm, no murmurs Pulmonary:  clear to auscultation, no wheezes, rales or rhonchi, symmetric air entry, good air exchange Extremities:  Full range of motion, no pedal edema Skin:  normal coloration and turgor, no rashes, no suspicious skin lesions  noted  Neurologic:  Cranial nerves 2-12 grossly intact,  Psychiatric:  Normal mood and affect, appropriate, no AH/VH Pelvic:  deferred   Labs/Studies:   CBC and Coags:  Lab Results  Component Value Date   WBC 9.7 05/30/2022   NEUTOPHILPCT 70 08/01/2019   HGB 12.9 05/30/2022   HCT 37.5 05/30/2022   MCV 86.4 05/30/2022   PLT 268 05/30/2022   CMP:  Lab Results  Component Value Date   NA 136 08/01/2019   K 4.2  08/01/2019   CL 100 08/01/2019   CO2 22 08/01/2019   BUN 9 08/01/2019   CREATININE 0.69 08/01/2019   CREATININE 0.69 05/15/2018   PROT 7.2 08/01/2019   BILITOT 0.3 08/01/2019   ALT 57 (H) 08/01/2019   AST 35 08/01/2019   ALKPHOS 166 (H) 08/01/2019    Assessment / Plan:   Paige Williamson is a 26 y.o. F G1P000 who presents with SAB.  Spontaneous miscarriage: Causes of spontaneous miscarriage discussed with patient, including prevalence, common causes, and the expectation that this event does not increase the chance that she will miscarry again in the future. Emotional support given.  Management options discussed, including: Expectant, medical and surgical.  Pt has opted for: suction D&C  Consents signed today. Risks of surgery were discussed with the patient including but not limited to: bleeding which may require transfusion; infection which may require antibiotics; injury to uterus or surrounding organs; intrauterine scarring which may impair future fertility; need for additional procedures including laparotomy or laparoscopy; and other postoperative/anesthesia complications. Written informed consent was obtained.  This is a scheduled same-day surgery. She will have a postop visit in 2 weeks to review operative findings and pathology.

## 2022-05-29 ENCOUNTER — Other Ambulatory Visit: Payer: Self-pay

## 2022-05-29 ENCOUNTER — Encounter
Admission: RE | Admit: 2022-05-29 | Discharge: 2022-05-29 | Disposition: A | Discharge status: Home / Self Care | Payer: BC Managed Care – PPO | Source: Ambulatory Visit | Attending: Obstetrics and Gynecology | Admitting: Obstetrics and Gynecology

## 2022-05-29 HISTORY — DX: Gastro-esophageal reflux disease without esophagitis: K21.9

## 2022-05-29 HISTORY — DX: Anxiety disorder, unspecified: F41.9

## 2022-05-29 HISTORY — DX: Obsessive-compulsive disorder, unspecified: F42.9

## 2022-05-29 NOTE — Patient Instructions (Addendum)
Your procedure is scheduled on: 7/25 /2023  Report to the Registration Desk on the 1st floor of the [facility]. To find out your arrival time, please call [phone] between 1PM - 3PM on:  05/29/2022  If your arrival time is 6:00 am, do not arrive prior to that time as the [facility] entrance doors do not open until 6:00 am.  REMEMBER: Instructions that are not followed completely may result in serious medical risk, up to and including death; or upon the discretion of your surgeon and anesthesiologist your surgery may need to be rescheduled.  Do not eat food after midnight the night before surgery.  No gum chewing, lozengers or hard candies.  You may however, drink CLEAR liquids up to 2 hours before you are scheduled to arrive for your surgery. Do not drink anything within 2 hours of your scheduled arrival time.  Clear liquids include: - water  - apple juice without pulp - gatorade (not RED colors) - black coffee or tea (Do NOT add milk or creamers to the coffee or tea) Do NOT drink anything that is not on this lis   One week prior to surgery: Stop Anti-inflammatories (NSAIDS) such as Advil, Aleve, Ibuprofen, Motrin, Naproxen, Naprosyn and Aspirin based products such as Excedrin, Goodys Powder, BC Powder. Stop ANY OVER THE COUNTER supplements until after surgery. You may however, continue to take Tylenol if needed for pain up until the day of surgery.  No Alcohol for 24 hours before or after surgery.  No Smoking including e-cigarettes for 24 hours prior to surgery.  No chewable tobacco products for at least 6 hours prior to surgery.  No nicotine patches on the day of surgery.  Do not use any "recreational" drugs for at least a week prior to your surgery.  Please be advised that the combination of cocaine and anesthesia may have negative outcomes, up to and including death. If you test positive for cocaine, your surgery will be cancelled.  On the morning of surgery brush  your teeth with toothpaste and water, you may rinse your mouth with mouthwash if you wish. Do not swallow any toothpaste or mouthwash.  You need to shower  on  the day of surgery  .  Do not wear jewelry, make-up, hairpins, clips or nail polish.  Do not wear lotions, powders, or perfumes.   Do not shave body from the neck down 48 hours prior to surgery just in case you cut yourself which could leave a site for infection.  Also, freshly shaved skin may become irritated if using the CHG soap.  Contact lenses, hearing aids and dentures may not be worn into surgery.  Do not bring valuables to the hospital. [facility] is not responsible for any missing/lost belongings or valuables.   Notify your doctor if there is any change in your medical condition (cold, fever, infection).  Wear comfortable clothing (specific to your surgery type) to the hospital.  After surgery, you can help prevent lung complications by doing breathing exercises.  If you are being admitted to the hospital overnight, leave your suitcase in the car. After surgery it may be brought to your room.  If you are being discharged the day of surgery, you will not be allowed to drive home. You will need a responsible adult (18 years or older) to drive you home and stay with you that night.   If you are taking public transportation, you will need to have a responsible adult (18 years or  older) with you. Please confirm with your physician that it is acceptable to use public transportation.   Please call the Pre-admissions Testing Dept. at [phone] if you have any questions about these instructions.  Surgery Visitation Policy:  Patients undergoing a surgery or procedure may have two family members or support persons with them as long as the person is not COVID-19 positive or experiencing its symptoms.

## 2022-05-30 ENCOUNTER — Ambulatory Visit
Admission: RE | Admit: 2022-05-30 | Discharge: 2022-05-30 | Disposition: A | Discharge status: Home / Self Care | Payer: BC Managed Care – PPO | Attending: Obstetrics and Gynecology | Admitting: Obstetrics and Gynecology

## 2022-05-30 ENCOUNTER — Encounter
Admission: RE | Disposition: A | Discharge status: Home / Self Care | Payer: Self-pay | Source: Home / Self Care | Attending: Obstetrics and Gynecology

## 2022-05-30 ENCOUNTER — Other Ambulatory Visit: Payer: Self-pay

## 2022-05-30 ENCOUNTER — Ambulatory Visit: Payer: BC Managed Care – PPO | Admitting: Certified Registered"

## 2022-05-30 ENCOUNTER — Encounter: Payer: Self-pay | Admitting: Obstetrics and Gynecology

## 2022-05-30 DIAGNOSIS — O021 Missed abortion: Secondary | ICD-10-CM | POA: Diagnosis present

## 2022-05-30 HISTORY — PX: DILATION AND EVACUATION: SHX1459

## 2022-05-30 LAB — CBC
HCT: 37.5 % (ref 36.0–46.0)
Hemoglobin: 12.9 g/dL (ref 12.0–15.0)
MCH: 29.7 pg (ref 26.0–34.0)
MCHC: 34.4 g/dL (ref 30.0–36.0)
MCV: 86.4 fL (ref 80.0–100.0)
Platelets: 268 10*3/uL (ref 150–400)
RBC: 4.34 MIL/uL (ref 3.87–5.11)
RDW: 12.2 % (ref 11.5–15.5)
WBC: 9.7 10*3/uL (ref 4.0–10.5)
nRBC: 0 % (ref 0.0–0.2)

## 2022-05-30 LAB — TYPE AND SCREEN
ABO/RH(D): O POS
Antibody Screen: NEGATIVE

## 2022-05-30 LAB — ABO/RH: ABO/RH(D): O POS

## 2022-05-30 SURGERY — DILATION AND EVACUATION, UTERUS
Anesthesia: General

## 2022-05-30 MED ORDER — DEXAMETHASONE SODIUM PHOSPHATE 10 MG/ML IJ SOLN
INTRAMUSCULAR | Status: DC | PRN
  Administered 2022-05-30: 10 mg via INTRAVENOUS

## 2022-05-30 MED ORDER — IBUPROFEN 800 MG PO TABS: 800.0000 mg | ORAL_TABLET | Freq: Three times a day (TID) | ORAL | 1 refills | Status: AC | PRN

## 2022-05-30 MED ORDER — DEXAMETHASONE SODIUM PHOSPHATE 10 MG/ML IJ SOLN
INTRAMUSCULAR | Status: AC
  Filled 2022-05-30: qty 1

## 2022-05-30 MED ORDER — MIDAZOLAM HCL 2 MG/2ML IJ SOLN
INTRAMUSCULAR | Status: AC
  Filled 2022-05-30: qty 2

## 2022-05-30 MED ORDER — POVIDONE-IODINE 10 % EX SWAB: 2.0000 | Freq: Once | CUTANEOUS | Status: DC

## 2022-05-30 MED ORDER — DOCUSATE SODIUM 100 MG PO CAPS: 100.0000 mg | ORAL_CAPSULE | Freq: Every day | ORAL | 0 refills | Status: DC | PRN

## 2022-05-30 MED ORDER — FENTANYL CITRATE (PF) 100 MCG/2ML IJ SOLN: 25.0000 ug | INTRAMUSCULAR | Status: DC | PRN

## 2022-05-30 MED ORDER — OXYCODONE HCL 5 MG/5ML PO SOLN: 5.0000 mg | Freq: Once | ORAL | Status: DC | PRN

## 2022-05-30 MED ORDER — FENTANYL CITRATE (PF) 100 MCG/2ML IJ SOLN
INTRAMUSCULAR | Status: AC
  Filled 2022-05-30: qty 2

## 2022-05-30 MED ORDER — LACTATED RINGERS IV SOLN: INTRAVENOUS | Status: DC

## 2022-05-30 MED ORDER — FAMOTIDINE 20 MG PO TABS: 20.0000 mg | ORAL_TABLET | Freq: Once | ORAL | Status: AC

## 2022-05-30 MED ORDER — PROPOFOL 10 MG/ML IV BOLUS
INTRAVENOUS | Status: AC
  Filled 2022-05-30: qty 20

## 2022-05-30 MED ORDER — KETOROLAC TROMETHAMINE 30 MG/ML IJ SOLN
INTRAMUSCULAR | Status: AC
  Filled 2022-05-30: qty 1

## 2022-05-30 MED ORDER — CHLORHEXIDINE GLUCONATE 0.12 % MT SOLN: 15.0000 mL | Freq: Once | OROMUCOSAL | Status: AC

## 2022-05-30 MED ORDER — KETOROLAC TROMETHAMINE 30 MG/ML IJ SOLN
INTRAMUSCULAR | Status: DC | PRN
  Administered 2022-05-30: 30 mg via INTRAVENOUS

## 2022-05-30 MED ORDER — FAMOTIDINE 20 MG PO TABS
ORAL_TABLET | ORAL | Status: AC
  Administered 2022-05-30: 20 mg via ORAL
  Filled 2022-05-30: qty 1

## 2022-05-30 MED ORDER — ONDANSETRON HCL 4 MG/2ML IJ SOLN
INTRAMUSCULAR | Status: AC
  Filled 2022-05-30: qty 2

## 2022-05-30 MED ORDER — MIDAZOLAM HCL 2 MG/2ML IJ SOLN
INTRAMUSCULAR | Status: DC | PRN
  Administered 2022-05-30: 2 mg via INTRAVENOUS

## 2022-05-30 MED ORDER — 0.9 % SODIUM CHLORIDE (POUR BTL) OPTIME
TOPICAL | Status: DC | PRN
  Administered 2022-05-30: 500 mL

## 2022-05-30 MED ORDER — ONDANSETRON 4 MG PO TBDP: 4.0000 mg | ORAL_TABLET | Freq: Three times a day (TID) | ORAL | 0 refills | Status: DC | PRN

## 2022-05-30 MED ORDER — DEXMEDETOMIDINE HCL IN NACL 200 MCG/50ML IV SOLN
INTRAVENOUS | Status: DC | PRN
  Administered 2022-05-30: 8 ug via INTRAVENOUS
  Administered 2022-05-30: 12 ug via INTRAVENOUS
  Administered 2022-05-30: 8 ug via INTRAVENOUS

## 2022-05-30 MED ORDER — CHLORHEXIDINE GLUCONATE 0.12 % MT SOLN
OROMUCOSAL | Status: AC
  Administered 2022-05-30: 15 mL via OROMUCOSAL
  Filled 2022-05-30: qty 15

## 2022-05-30 MED ORDER — LIDOCAINE HCL (CARDIAC) PF 100 MG/5ML IV SOSY
PREFILLED_SYRINGE | INTRAVENOUS | Status: DC | PRN
  Administered 2022-05-30: 100 mg via INTRAVENOUS

## 2022-05-30 MED ORDER — GLYCOPYRROLATE 0.2 MG/ML IJ SOLN
INTRAMUSCULAR | Status: DC | PRN
  Administered 2022-05-30: .2 mg via INTRAVENOUS

## 2022-05-30 MED ORDER — OXYCODONE HCL 5 MG PO TABS: 5.0000 mg | ORAL_TABLET | Freq: Once | ORAL | Status: DC | PRN

## 2022-05-30 MED ORDER — DOXYCYCLINE HYCLATE 100 MG PO TABS
100.0000 mg | ORAL_TABLET | Freq: Once | ORAL | Status: AC
  Administered 2022-05-30: 100 mg via ORAL
  Filled 2022-05-30: qty 1

## 2022-05-30 MED ORDER — ONDANSETRON HCL 4 MG/2ML IJ SOLN
INTRAMUSCULAR | Status: DC | PRN
  Administered 2022-05-30: 4 mg via INTRAVENOUS

## 2022-05-30 MED ORDER — DEXMEDETOMIDINE HCL IN NACL 200 MCG/50ML IV SOLN: INTRAVENOUS | Status: DC | PRN

## 2022-05-30 MED ORDER — SILVER NITRATE-POT NITRATE 75-25 % EX MISC
CUTANEOUS | Status: AC
  Filled 2022-05-30: qty 10

## 2022-05-30 MED ORDER — ONDANSETRON HCL 4 MG/2ML IJ SOLN
4.0000 mg | Freq: Once | INTRAMUSCULAR | Status: DC | PRN
Start: 2022-05-30 — End: 2022-05-30

## 2022-05-30 MED ORDER — ACETAMINOPHEN 10 MG/ML IV SOLN: 1000.0000 mg | Freq: Once | INTRAVENOUS | Status: DC | PRN

## 2022-05-30 MED ORDER — DOXYCYCLINE HYCLATE 100 MG PO TABS
200.0000 mg | ORAL_TABLET | Freq: Once | ORAL | Status: AC
  Administered 2022-05-30: 200 mg via ORAL
  Filled 2022-05-30: qty 2

## 2022-05-30 MED ORDER — SUCCINYLCHOLINE CHLORIDE 200 MG/10ML IV SOSY
PREFILLED_SYRINGE | INTRAVENOUS | Status: DC | PRN
  Administered 2022-05-30: 100 mg via INTRAVENOUS

## 2022-05-30 MED ORDER — ORAL CARE MOUTH RINSE: 15.0000 mL | Freq: Once | OROMUCOSAL | Status: AC

## 2022-05-30 MED ORDER — FENTANYL CITRATE (PF) 100 MCG/2ML IJ SOLN
INTRAMUSCULAR | Status: DC | PRN
  Administered 2022-05-30 (×2): 50 ug via INTRAVENOUS

## 2022-05-30 MED ORDER — PROPOFOL 10 MG/ML IV BOLUS
INTRAVENOUS | Status: DC | PRN
  Administered 2022-05-30: 150 mg via INTRAVENOUS

## 2022-05-30 SURGICAL SUPPLY — 27 items
DRSG TELFA 3X8 NADH (GAUZE/BANDAGES/DRESSINGS) IMPLANT
FILTER UTR ASPR SPEC (MISCELLANEOUS) ×1 IMPLANT
FLTR UTR ASPR SPEC (MISCELLANEOUS) ×2
GLOVE BIO SURGEON STRL SZ7 (GLOVE) ×2 IMPLANT
GLOVE SURG UNDER LTX SZ7.5 (GLOVE) ×2 IMPLANT
GOWN STRL REUS W/ TWL LRG LVL3 (GOWN DISPOSABLE) ×2 IMPLANT
GOWN STRL REUS W/TWL LRG LVL3 (GOWN DISPOSABLE) ×2
KIT BERKELEY 1ST TRIMESTER 3/8 (MISCELLANEOUS) ×2 IMPLANT
KIT TURNOVER CYSTO (KITS) ×2 IMPLANT
MANIFOLD NEPTUNE II (INSTRUMENTS) ×1 IMPLANT
PACK DNC HYST (MISCELLANEOUS) ×2 IMPLANT
PAD DRESSING TELFA 3X8 NADH (GAUZE/BANDAGES/DRESSINGS) IMPLANT
PAD OB MATERNITY 4.3X12.25 (PERSONAL CARE ITEMS) ×2 IMPLANT
PAD PREP 24X41 OB/GYN DISP (PERSONAL CARE ITEMS) ×2 IMPLANT
SCRUB CHG 4% DYNA-HEX 4OZ (MISCELLANEOUS) ×2 IMPLANT
SET BERKELEY SUCTION TUBING (SUCTIONS) ×2 IMPLANT
SET CYSTO W/LG BORE CLAMP LF (SET/KITS/TRAYS/PACK) IMPLANT
SOL PREP PVP 2OZ (MISCELLANEOUS) ×2
SOLUTION PREP PVP 2OZ (MISCELLANEOUS) ×1 IMPLANT
TOWEL OR 17X26 4PK STRL BLUE (TOWEL DISPOSABLE) ×2 IMPLANT
VACURETTE 10 RIGID CVD (CANNULA) IMPLANT
VACURETTE 6 ASPIR F TIP BERK (CANNULA) IMPLANT
VACURETTE 7MM F TIP (CANNULA) ×1
VACURETTE 7MM F TIP STRL (CANNULA) IMPLANT
VACURETTE 8 RIGID CVD (CANNULA) IMPLANT
VACURETTE 8MM F TIP (MISCELLANEOUS) ×1 IMPLANT
WATER STERILE IRR 500ML POUR (IV SOLUTION) ×1 IMPLANT

## 2022-05-30 NOTE — Op Note (Signed)
Operative Report Suction Dilation and Curettage   Indications: Miscarriage   Pre-operative Diagnosis: Missed abortion at 9 weeks  Post-operative Diagnosis: same.  Procedure: 1. Suction D&C  Surgeon: Christeen Douglas  Assistant(s):  None  Anesthesia: General LMA anesthesia  Estimated Blood Loss:  less than 100 mL         Intraoperative medications:  toradol         Total IV Fluids:  Urine Output: 58ml         Specimens: products of conception         Complications:  None; patient tolerated the procedure well.         Disposition: PACU - hemodynamically stable.         Condition: stable  Findings: Uterus measuring 7 weeks; normal cervix, vagina, perineum.   Indication for procedure/Consents: 26 y.o. F G1P0010  here for scheduled surgery for the aforementioned diagnoses.     Risks of surgery were discussed with the patient including but not limited to: bleeding which may require transfusion; infection which may require antibiotics; injury to uterus or surrounding organs; intrauterine scarring which may impair future fertility; need for additional procedures including laparotomy or laparoscopy; and other postoperative/anesthesia complications. Written informed consent was obtained.    Procedure Details:   The patient received oral antibiotics while in the preoperative area.  She was then taken to the operating room where general anesthesia was administered and was found to be adequate.  After a formal and adequate timeout was performed, she was placed in the dorsal lithotomy position and examined with the above findings. She was then prepped and draped in the sterile manner.   Her bladder was catheterized for an estimated amount of clear, yellow urine. A speculum was then placed in the patient's vagina and a single tooth tenaculum was applied to the anterior lip of the cervix.    No uterine sounding was performed on this pregnant uterus. Her cervix was serially dilated  to accommodate a 7 sized flexible suction curette.  A sharp curettage was then performed until there was a gritty texture in all four quadrants.  The tenaculum was removed from the anterior lip of the cervix and the vaginal speculum was removed after noting good hemostasis. The patient tolerated the procedure well and was taken to the recovery area awake, extubated and in stable condition.  The patient will be discharged to home as per PACU criteria.  She will receive another dose of oral antibiotics prior to discharge. Routine postoperative instructions given.  She was prescribed zofran, Ibuprofen and Colace.  She will follow up in the clinic in two weeks for postoperative evaluation.

## 2022-05-30 NOTE — Discharge Instructions (Addendum)
Discharge instructions after a Dilation and Curettage  Signs and Symptoms to Report  Call our office at [phone] if you have any of the following:    Fever over 100.4 degrees or higher  Severe stomach pain not relieved with pain medications  Bright red bleeding that's heavier than a period that does not slow with rest after the first 24 hours  To go the bathroom a lot (frequency), you can't hold your urine (urgency), or it hurts when you empty your bladder (urinate)  Chest pain  Shortness of breath  Pain in the calves of your legs  Severe nausea and vomiting not relieved with anti-nausea medications  Any concerns  What You Can Expect after Surgery  You may see some pink tinged, bloody fluid. This is normal. You may also have cramping for several days.   Activities after Your Discharge Follow these guidelines to help speed your recovery at home:  Don't drive if you are in pain or taking narcotic pain medicine. You may drive when you can safely slam on the brakes, turn the wheel forcefully, and rotate your torso comfortably. This is typically 4-7 days. Practice in a parking lot or side street prior to attempting to drive regularly.   Ask others to help with household chores until you feel up to doing tasks.  Don't do strenuous activities, exercises, or sports like vacuuming, tennis, squash, etc. until your doctor says it is safe to do so.  Walk as you feel able. Rest often since it may take a week or two for your energy level to return to normal.   You may climb stairs  Avoid constipation:   -Eat fruits, vegetables, and whole grains. Eat small meals as your appetite will take time to return to normal.   -Drink 6 to 8 glasses of water each day unless your doctor has told you to limit your fluids.   -Use a laxative or stool softener as needed if constipation becomes a problem. You may take Miralax, metamucil, Citrucil, Colace, Senekot, FiberCon, etc. If this does not relieve the  constipation, try two tablespoons of Milk Of Magnesia every 8 hours until your bowels move.   You may shower.   Do not get in a hot tub, swimming pool, etc. until your doctor agrees.  Do not douche, use tampons, or have sex until you stop spotting, usually about 2 weeks.  Take your pain medicine when you need it. The medicine may not work as well if the pain is bad.  Take the medicines you were taking before surgery. Other medications you might need are pain medications (ibuprofen), medications for constipation (Colace) and nausea medications (Zofran).       Coping with Pregnancy Loss Pregnancy loss can happen any time during a pregnancy. Often the cause is not known. It is rarely because of anything you did. Pregnancy loss in early pregnancy (during the first trimester) is called a miscarriage. This type of pregnancy loss is the most common.   Any pregnancy loss can be devastating. You will need to recover both physically and emotionally. Most women are able to get pregnant again after a pregnancy loss and deliver a healthy baby.  How to manage emotional recovery  Pregnancy loss is very hard emotionally. You may feel many different emotions while you grieve. You may feel sad and angry. You may also feel guilty. It is normal to have periods of crying. Emotional recovery can take longer than physical recovery. It is different for everyone.  Some women may feel back to normal quickly and others take longer. Taking these steps can help you cope: Remember that it is unlikely you did anything to cause the pregnancy loss. Share your thoughts and feelings with friends, family, and your partner. Remember that your partner is also recovering emotionally. Make sure you have a good support system, and do not spend too much time alone. Meet with a pregnancy loss counselor or join a pregnancy loss support group. Get enough sleep and eat a healthy diet. Return to regular exercise when you have recovered  physically. Do not use drugs or alcohol to manage your emotions. Consider seeing a mental health professional to help you recover emotionally. Ask a friend or loved one to help you decide what to do with any clothing and nursery items you received for your baby.  How to recognize emotional stress It is normal to have emotional stress after a pregnancy loss. But emotional stress that lasts a long time or becomes severe requires treatment. Watch out for these signs of severe emotional stress: Sadness, anger, or guilt that is not going away and is interfering with your normal activities. Relationship problems that have occurred or gotten worse since the pregnancy loss. Signs of depression that last longer than 2 weeks. These may include: Sadness. Anxiety. Hopelessness. Loss of interest in activities you enjoy. Inability to concentrate. Trouble sleeping or sleeping too much. Loss of appetite or overeating. Thoughts of death or of hurting yourself. Follow these instructions at home: Medicines Take over-the-counter and prescription medicines only as told by your health care provider. Activity Rest at home until your energy level returns. Return to your normal activities as told by your health care provider. Ask your health care provider what activities are safe for you. General instructions Keep all follow-up visits as told by your health care provider. This is important. It may be helpful to meet with others who have experienced pregnancy loss. Ask your health care provider about support groups and resources. To help you and your partner with the process of grieving, talk with your health care provider or seek counseling. When you are ready, meet with your health care provider to discuss steps to take for a future pregnancy. Where to find more information [place] Department of Health and Programmer, systems on Women's Health: [place] American Pregnancy Association:  www.americanpregnancy.org Contact a health care provider if: You continue to experience grief, sadness, or lack of motivation for everyday activities, and those feelings do not improve over time. You are struggling to recover emotionally, especially if you are using alcohol or substances to help. Get help right away if: You have thoughts of hurting yourself or others. If you ever feel like you may hurt yourself or others, or have thoughts about taking your own life, get help right away. You can go to your nearest emergency department or call: Your local emergency services (911 in the [place] A suicide crisis helpline, such as the [facility] at 905-626-8818. This is open 24 hours a day. Summary Any pregnancy loss can be difficult physically and emotionally. You may experience many different emotions while you grieve. Emotional recovery can last longer than physical recovery. It is normal to have emotional stress after a pregnancy loss. But emotional stress that lasts a long time or becomes severe requires treatment. See your health care provider if you are struggling emotionally after a pregnancy loss. This information is not intended to replace advice given to you by your health  care provider. Make sure you discuss any questions you have with your health care provider. Document Released: 01/03/2018 Document Revised: 01/03/2018 Document Reviewed: 01/03/2018 Elsevier Interactive Patient Education  2019 [employer]vier Inc.   AMBULATORY SURGERY  DISCHARGE INSTRUCTIONS   The drugs that you were given will stay in your system until tomorrow so for the next 24 hours you should not:  Drive an automobile Make any legal decisions Drink any alcoholic beverage   You may resume regular meals tomorrow.  Today it is better to start with liquids and gradually work up to solid foods.  You may eat anything you prefer, but it is better to start with liquids, then soup and crackers,  and gradually work up to solid foods.   Please notify your doctor immediately if you have any unusual bleeding, trouble breathing, redness and pain at the surgery site, drainage, fever, or pain not relieved by medication.    Additional Instructions:     Please contact your physician with any problems or Same Day Surgery at [phone], Monday through Friday 6 am to 4 pm, or [place] at [facility] number at [phone]. AMBULATORY SURGERY  DISCHARGE INSTRUCTIONS

## 2022-05-30 NOTE — Anesthesia Postprocedure Evaluation (Signed)
Anesthesia Post Note  Patient: Paige Williamson  Procedure(s) Performed: SUCTION D&C  Patient location during evaluation: PACU Anesthesia Type: General Level of consciousness: awake and alert Pain management: pain level controlled Vital Signs Assessment: post-procedure vital signs reviewed and stable Respiratory status: spontaneous breathing, nonlabored ventilation, respiratory function stable and patient connected to nasal cannula oxygen Cardiovascular status: blood pressure returned to baseline and stable Postop Assessment: no apparent nausea or vomiting Anesthetic complications: no   No notable events documented.   Last Vitals:  Vitals:   05/30/22 1307 05/30/22 1315  BP: 103/69 117/71  Pulse:  83  Resp: (!) 22 20  Temp:    SpO2: 95% 98%    Last Pain:  Vitals:   05/30/22 1315  TempSrc:   PainSc: 0-No pain                 Corinda Gubler

## 2022-05-30 NOTE — Transfer of Care (Signed)
Immediate Anesthesia Transfer of Care Note  Patient: Paige Williamson  Procedure(s) Performed: SUCTION D&C  Patient Location: PACU  Anesthesia Type:General  Level of Consciousness: drowsy and patient cooperative  Airway & Oxygen Therapy: Patient Spontanous Breathing and Patient connected to face mask oxygen  Post-op Assessment: Report given to RN and Post -op Vital signs reviewed and stable  Post vital signs: Reviewed and stable  Last Vitals:  Vitals Value Taken Time  BP 103/69 05/30/22 1307  Temp    Pulse 93 05/30/22 1307  Resp 21 05/30/22 1307  SpO2 94 % 05/30/22 1307  Vitals shown include unvalidated device data.  Last Pain:  Vitals:   05/30/22 0936  TempSrc: Temporal  PainSc: 0-No pain         Complications: No notable events documented.

## 2022-05-30 NOTE — Anesthesia Preprocedure Evaluation (Signed)
Anesthesia Evaluation  Patient identified by MRN, date of birth, ID band Patient awake    Reviewed: Allergy & Precautions, NPO status , Patient's Chart, lab work & pertinent test results  History of Anesthesia Complications Negative for: history of anesthetic complications  Airway Mallampati: II  TM Distance: >3 FB Neck ROM: Full    Dental no notable dental hx. (+) Teeth Intact   Pulmonary neg pulmonary ROS, neg sleep apnea, neg COPD, Patient abstained from smoking.Not current smoker,    Pulmonary exam normal breath sounds clear to auscultation       Cardiovascular Exercise Tolerance: Good METS(-) hypertension(-) CAD and (-) Past MI negative cardio ROS  (-) dysrhythmias  Rhythm:Regular Rate:Normal - Systolic murmurs    Neuro/Psych PSYCHIATRIC DISORDERS Anxiety Depression negative neurological ROS     GI/Hepatic GERD  Controlled,(+)     (-) substance abuse  ,   Endo/Other  neg diabetesMorbid obesity  Renal/GU negative Renal ROS     Musculoskeletal   Abdominal (+) + obese,   Peds  Hematology   Anesthesia Other Findings Past Medical History: No date: Anxiety No date: GERD (gastroesophageal reflux disease) No date: OCD (obsessive compulsive disorder)  Reproductive/Obstetrics Missed abortion                             Anesthesia Physical Anesthesia Plan  ASA: 3  Anesthesia Plan: General   Post-op Pain Management: Ofirmev IV (intra-op)* and Toradol IV (intra-op)*   Induction: Intravenous  PONV Risk Score and Plan: 4 or greater and Ondansetron, Dexamethasone and Midazolam  Airway Management Planned: Oral ETT  Additional Equipment: None  Intra-op Plan:   Post-operative Plan: Extubation in OR  Informed Consent: I have reviewed the patients History and Physical, chart, labs and discussed the procedure including the risks, benefits and alternatives for the proposed anesthesia  with the patient or authorized representative who has indicated his/her understanding and acceptance.     Dental advisory given  Plan Discussed with: CRNA and Surgeon  Anesthesia Plan Comments: (Discussed risks of anesthesia with patient, including PONV, sore throat, lip/dental/eye damage. Rare risks discussed as well, such as cardiorespiratory and neurological sequelae, and allergic reactions. Discussed the role of CRNA in patient's perioperative care. Patient understands.] Patient informed about increased incidence of above perioperative risk due to high BMI. Patient understands. )        Anesthesia Quick Evaluation

## 2022-05-30 NOTE — Anesthesia Procedure Notes (Signed)
Procedure Name: Intubation Date/Time: 05/30/2022 12:29 PM  Performed by: Omer Jack, CRNAPre-anesthesia Checklist: Patient identified, Patient being monitored, Timeout performed, Emergency Drugs available and Suction available Patient Re-evaluated:Patient Re-evaluated prior to induction Oxygen Delivery Method: Circle system utilized Preoxygenation: Pre-oxygenation with 100% oxygen Induction Type: IV induction Ventilation: Mask ventilation without difficulty Laryngoscope Size: 3 and McGraph Grade View: Grade I Tube type: Oral Tube size: 7.0 mm Number of attempts: 1 Airway Equipment and Method: Stylet Placement Confirmation: ETT inserted through vocal cords under direct vision, positive ETCO2 and breath sounds checked- equal and bilateral Secured at: 20 cm Tube secured with: Tape Dental Injury: Teeth and Oropharynx as per pre-operative assessment

## 2022-05-31 ENCOUNTER — Encounter: Payer: Self-pay | Admitting: Obstetrics and Gynecology

## 2022-05-31 LAB — SURGICAL PATHOLOGY

## 2022-06-03 ENCOUNTER — Other Ambulatory Visit: Payer: Self-pay

## 2022-06-03 ENCOUNTER — Emergency Department: Payer: BC Managed Care – PPO

## 2022-06-03 DIAGNOSIS — Z79899 Other long term (current) drug therapy: Secondary | ICD-10-CM | POA: Insufficient documentation

## 2022-06-03 DIAGNOSIS — O031 Delayed or excessive hemorrhage following incomplete spontaneous abortion: Principal | ICD-10-CM | POA: Insufficient documentation

## 2022-06-03 DIAGNOSIS — N179 Acute kidney failure, unspecified: Secondary | ICD-10-CM | POA: Insufficient documentation

## 2022-06-03 LAB — CBC
HCT: 36.1 % (ref 36.0–46.0)
Hemoglobin: 12.5 g/dL (ref 12.0–15.0)
MCH: 30 pg (ref 26.0–34.0)
MCHC: 34.6 g/dL (ref 30.0–36.0)
MCV: 86.8 fL (ref 80.0–100.0)
Platelets: 313 10*3/uL (ref 150–400)
RBC: 4.16 MIL/uL (ref 3.87–5.11)
RDW: 12.2 % (ref 11.5–15.5)
WBC: 17.8 10*3/uL — ABNORMAL HIGH (ref 4.0–10.5)
nRBC: 0 % (ref 0.0–0.2)

## 2022-06-03 NOTE — ED Triage Notes (Signed)
Pt states she had a d and c last week for a miscarriage. Pt states she was at the grocery store this pm when "I felt something burst". Pt states is having vaginal bleeding with clot. Pt states bleeding began at 2130.

## 2022-06-04 ENCOUNTER — Encounter: Payer: Self-pay | Admitting: Obstetrics

## 2022-06-04 ENCOUNTER — Observation Stay
Admission: EM | Admit: 2022-06-04 | Discharge: 2022-06-04 | Disposition: A | Discharge status: Home / Self Care | Payer: BC Managed Care – PPO | Attending: Obstetrics | Admitting: Obstetrics

## 2022-06-04 DIAGNOSIS — N939 Abnormal uterine and vaginal bleeding, unspecified: Principal | ICD-10-CM | POA: Diagnosis present

## 2022-06-04 DIAGNOSIS — N179 Acute kidney failure, unspecified: Secondary | ICD-10-CM

## 2022-06-04 LAB — COMPREHENSIVE METABOLIC PANEL
ALT: 25 U/L (ref 0–44)
AST: 25 U/L (ref 15–41)
Albumin: 3.8 g/dL (ref 3.5–5.0)
Alkaline Phosphatase: 102 U/L (ref 38–126)
Anion gap: 5 (ref 5–15)
BUN: 10 mg/dL (ref 6–20)
CO2: 27 mmol/L (ref 22–32)
Calcium: 8.9 mg/dL (ref 8.9–10.3)
Chloride: 103 mmol/L (ref 98–111)
Creatinine, Ser: 1.24 mg/dL — ABNORMAL HIGH (ref 0.44–1.00)
GFR, Estimated: >60 mL/min (ref >60)
Glucose, Bld: 94 mg/dL (ref 70–99)
Potassium: 3.8 mmol/L (ref 3.5–5.1)
Sodium: 135 mmol/L (ref 135–145)
Total Bilirubin: 0.6 mg/dL (ref 0.3–1.2)
Total Protein: 7.1 g/dL (ref 6.5–8.1)

## 2022-06-04 LAB — CBC
HCT: 32.7 % — ABNORMAL LOW (ref 36.0–46.0)
Hemoglobin: 11.4 g/dL — ABNORMAL LOW (ref 12.0–15.0)
MCH: 29.6 pg (ref 26.0–34.0)
MCHC: 34.9 g/dL (ref 30.0–36.0)
MCV: 84.9 fL (ref 80.0–100.0)
Platelets: 277 10*3/uL (ref 150–400)
RBC: 3.85 MIL/uL — ABNORMAL LOW (ref 3.87–5.11)
RDW: 12.4 % (ref 11.5–15.5)
WBC: 13.6 10*3/uL — ABNORMAL HIGH (ref 4.0–10.5)
nRBC: 0 % (ref 0.0–0.2)

## 2022-06-04 LAB — HCG, QUANTITATIVE, PREGNANCY: hCG, Beta Chain, Quant, S: 816 m[IU]/mL — ABNORMAL HIGH (ref <5)

## 2022-06-04 MED ORDER — SODIUM CHLORIDE 0.9 % IV BOLUS (SEPSIS)
1000.0000 mL | Freq: Once | INTRAVENOUS | Status: AC
  Administered 2022-06-04: 1000 mL via INTRAVENOUS

## 2022-06-04 MED ORDER — METHYLERGONOVINE MALEATE 0.2 MG/ML IJ SOLN: 0.2000 mg | INTRAMUSCULAR | Status: DC | PRN

## 2022-06-04 MED ORDER — TRANEXAMIC ACID-NACL 1000-0.7 MG/100ML-% IV SOLN
1000.0000 mg | Freq: Once | INTRAVENOUS | Status: AC
  Administered 2022-06-04: 1000 mg via INTRAVENOUS
  Filled 2022-06-04 (×2): qty 100

## 2022-06-04 MED ORDER — ACETAMINOPHEN 500 MG PO TABS
1000.0000 mg | ORAL_TABLET | Freq: Four times a day (QID) | ORAL | Status: DC | PRN
Start: 2022-06-04 — End: 2022-06-04

## 2022-06-04 MED ORDER — LACTATED RINGERS IV SOLN: INTRAVENOUS | Status: DC

## 2022-06-04 MED ORDER — DOXYCYCLINE HYCLATE 100 MG PO TABS
100.0000 mg | ORAL_TABLET | Freq: Two times a day (BID) | ORAL | Status: DC
  Filled 2022-06-04: qty 1

## 2022-06-04 MED ORDER — DOXYCYCLINE HYCLATE 100 MG PO TABS: 100.0000 mg | ORAL_TABLET | Freq: Two times a day (BID) | ORAL | 0 refills | Status: AC

## 2022-06-04 MED ORDER — METHYLERGONOVINE MALEATE 0.2 MG PO TABS: 0.2000 mg | ORAL_TABLET | ORAL | Status: DC | PRN

## 2022-06-04 MED ORDER — ACETAMINOPHEN 500 MG PO TABS: 1000.0000 mg | ORAL_TABLET | Freq: Four times a day (QID) | ORAL | 0 refills | Status: DC | PRN

## 2022-06-04 MED ORDER — SODIUM CHLORIDE 0.9 % IV SOLN: INTRAVENOUS | Status: DC | PRN

## 2022-06-04 NOTE — Discharge Summary (Signed)
Obstetric and Gynecology  Subjective  Paige Williamson is a 26 y.o. female G1P0 who presented on 06/04/2022 for a gush of clear fluid with a foul odor and  heavy vaginal bleeding s/p D&C on 05/30/22 . She denies any SOB, Chest pains, N/V, or abdominal tenderness.     Objective   Vitals:   06/04/22 0258 06/04/22 0754  BP: 129/76 130/81  Pulse: 94 86  Resp: 20 20  Temp: 98.5 F (36.9 C) 98 F (36.7 C)  SpO2: 99% 99%     Intake/Output Summary (Last 24 hours) at 06/04/2022 0923 Last data filed at 06/04/2022 0402 Gross per 24 hour  Intake 171.41 ml  Output --  Net 171.41 ml     General: NAD Cardiovascular: RRR, no murmurs Pulmonary: CTAB Abdomen: Benign. Non-tender, +BS, no guarding. Extremities: No erythema or cords, no calf tenderness, +warmth with normal peripheral pulses.  Labs: Results for orders placed or performed during the hospital encounter of 06/04/22 (from the past 24 hour(s))  hCG, quantitative, pregnancy     Status: Abnormal   Collection Time: 06/03/22 11:24 PM  Result Value Ref Range   hCG, Beta Chain, Quant, S 816 (H) <5 mIU/mL  Comprehensive metabolic panel     Status: Abnormal   Collection Time: 06/03/22 11:26 PM  Result Value Ref Range   Sodium 135 135 - 145 mmol/L   Potassium 3.8 3.5 - 5.1 mmol/L   Chloride 103 98 - 111 mmol/L   CO2 27 22 - 32 mmol/L   Glucose, Bld 94 70 - 99 mg/dL   BUN 10 6 - 20 mg/dL   Creatinine, Ser 7.89 (H) 0.44 - 1.00 mg/dL   Calcium 8.9 8.9 - 38.1 mg/dL   Total Protein 7.1 6.5 - 8.1 g/dL   Albumin 3.8 3.5 - 5.0 g/dL   AST 25 15 - 41 U/L   ALT 25 0 - 44 U/L   Alkaline Phosphatase 102 38 - 126 U/L   Total Bilirubin 0.6 0.3 - 1.2 mg/dL   GFR, Estimated >01 >75 mL/min   Anion gap 5 5 - 15  CBC     Status: Abnormal   Collection Time: 06/03/22 11:26 PM  Result Value Ref Range   WBC 17.8 (H) 4.0 - 10.5 K/uL   RBC 4.16 3.87 - 5.11 MIL/uL   Hemoglobin 12.5 12.0 - 15.0 g/dL   HCT 10.2 58.5 - 27.7 %   MCV 86.8 80.0 - 100.0 fL    MCH 30.0 26.0 - 34.0 pg   MCHC 34.6 30.0 - 36.0 g/dL   RDW 82.4 23.5 - 36.1 %   Platelets 313 150 - 400 K/uL   nRBC 0.0 0.0 - 0.2 %  CBC     Status: Abnormal   Collection Time: 06/04/22  5:30 AM  Result Value Ref Range   WBC 13.6 (H) 4.0 - 10.5 K/uL   RBC 3.85 (L) 3.87 - 5.11 MIL/uL   Hemoglobin 11.4 (L) 12.0 - 15.0 g/dL   HCT 44.3 (L) 15.4 - 00.8 %   MCV 84.9 80.0 - 100.0 fL   MCH 29.6 26.0 - 34.0 pg   MCHC 34.9 30.0 - 36.0 g/dL   RDW 67.6 19.5 - 09.3 %   Platelets 277 150 - 400 K/uL   nRBC 0.0 0.0 - 0.2 %    Cultures: Results for orders placed or performed in visit on 08/01/19  Microscopic Examination     Status: Abnormal   Collection Time: 08/01/19  1:21 PM  URINE  Result Value Ref Range Status   WBC, UA 6-10 (A) 0 - 5 /hpf Final   RBC, Urine 0-2 0 - 2 /hpf Final   Epithelial Cells (non renal) 0-10 0 - 10 /hpf Final   Bacteria, UA Moderate (A) None seen/Few Final  Urine Culture, Reflex     Status: Abnormal   Collection Time: 08/01/19  1:21 PM   URINE  Result Value Ref Range Status   Urine Culture, Routine Final report (A)  Final   Organism ID, Bacteria Proteus mirabilis (A)  Final    Comment: Greater than 100,000 colony forming units per mL   ORGANISM ID, BACTERIA Comment  Final    Comment: Mixed urogenital flora 25,000-50,000 colony forming units per mL    Antimicrobial Susceptibility Comment  Final    Comment:       ** S = Susceptible; I = Intermediate; R = Resistant **                    P = Positive; N = Negative             MICS are expressed in micrograms per mL    Antibiotic                 RSLT#1    RSLT#2    RSLT#3    RSLT#4 Amoxicillin/Clavulanic Acid    S Ampicillin                     S Cefepime                       S Ceftriaxone                    S Cefuroxime                     S Ciprofloxacin                  S Ertapenem                      S Gentamicin                     S Levofloxacin                   S Meropenem                       S Nitrofurantoin                 R Piperacillin/Tazobactam        S Tetracycline                   R Tobramycin                     S Trimethoprim/Sulfa             S     Imaging: US PELVIC COMPLETE WITH TRANSVAGINAL  Result Date: 06/04/2022 CLINICAL DATA:  [id number]. Presents with vaginal bleeding following a D and C 4 days ago. LMP 02/27/2022. EXAM: TRANSABDOMINAL AND TRANSVAGINAL ULTRASOUND OF PELVIS COLOR DOPPLER ULTRASOUND OF OVARIES TECHNIQUE: Both transabdominal and transvaginal ultrasound examinations of the pelvis were performed. Transabdominal technique was performed for global imaging of the pelvis including uterus, ovaries, adnexal regions, and pelvic cul-de-sac. It was necessary to proceed  with endovaginal exam following the transabdominal exam to visualize the ovaries, endometrium and adnexal areas better. Color Doppler ultrasound was utilized to evaluate blood flow to the ovaries. COMPARISON:  None Available. FINDINGS: Uterus Measurements: 9.2 x 5.8 x 6.5 cm = volume: 179.7 mL. No fibroids or other mass visualized. The cervix is closed measuring 3.3 cm length. Endometrium Thickness: 10.6 mm in the distal cavity. In the proximal uterine cavity there is a 3.4 x 1.7 x 2.4 cm echogenic heterogeneous avascular mass which is most likely a blood clot. Right ovary Measurements: 3.0 x 2.8 x 2.5 cm = volume: 11 mL. Normal appearance/no adnexal mass. Left ovary Measurements: 3.5 x 2.4 x 2.5 cm = volume: 10.9 mL. Normal appearance/no adnexal mass. Color doppler evaluation of both ovaries demonstrates normal appearing color flow. Other findings There is minimal anechoic pelvic cul-de-sac fluid, nonspecific but usually physiologic at this age. IMPRESSION: 1. 3.4 x 1.7 x 2.4 cm echogenic avascular mass in the proximal uterine cavity, most likely a blood clot. Retained products of conception is possible but would usually show color flow. 2. Minimal anechoic free fluid. 3. No evidence of ovarian torsion or  mass. Electronically Signed   By: Almira Bar M.D.   On: 06/04/2022 00:16     Assessment   25 y.o. [facility] Day: 1 admitted for observation throughout the night for a gush of fluid and heavy vaginal bleeding s/p D&C on 05/30/22. Upon assessment today bleeding is minimal  uterus is non tender to palpation and vitals are WNL.   Plan   1. Start Doxycycline 100 mg BID for 7 days 2.Return to lab for betahcg on 06/06/22 3. F/u with Dr Dalbert Garnet on 06/08/22 4. Notify Provider if bleeding becomes heavy again, report to ED if soaking 1 pad an hour.   Chari Manning, CNM Certified Nurse Midwife [facility]

## 2022-06-04 NOTE — Progress Notes (Signed)
Discharge instructions given and reviewed with pt and family. Pt educated on follow up care, appointments and when to notify provider, questions invited and answered.  Pt and family noted understanding  to teaching/education.Pt ambulated off unit safety with stable gait to personal discharge vehicle. No distress noted.

## 2022-06-04 NOTE — Progress Notes (Signed)
Obstetric and Gynecology  Subjective  Paige Williamson is a 26 y.o. female G1P0 who presented on 06/04/2022 for a gush of clear fluid with a foul odor and  heavy vaginal bleeding s/p D&C on 05/30/22 . She denies any SOB, Chest pains, N/V, or abdominal tenderness.     Objective   Vitals:   06/04/22 0258 06/04/22 0754  BP: 129/76 130/81  Pulse: 94 86  Resp: 20 20  Temp: 98.5 F (36.9 C) 98 F (36.7 C)  SpO2: 99% 99%     Intake/Output Summary (Last 24 hours) at 06/04/2022 0905 Last data filed at 06/04/2022 0402 Gross per 24 hour  Intake 171.41 ml  Output --  Net 171.41 ml    General: NAD Cardiovascular: RRR, no murmurs Pulmonary: CTAB Abdomen: Benign. Non-tender, +BS, no guarding. Extremities: No erythema or cords, no calf tenderness, +warmth with normal peripheral pulses.  Labs: Results for orders placed or performed during the hospital encounter of 06/04/22 (from the past 24 hour(s))  hCG, quantitative, pregnancy     Status: Abnormal   Collection Time: 06/03/22 11:24 PM  Result Value Ref Range   hCG, Beta Chain, Quant, S 816 (H) <5 mIU/mL  Comprehensive metabolic panel     Status: Abnormal   Collection Time: 06/03/22 11:26 PM  Result Value Ref Range   Sodium 135 135 - 145 mmol/L   Potassium 3.8 3.5 - 5.1 mmol/L   Chloride 103 98 - 111 mmol/L   CO2 27 22 - 32 mmol/L   Glucose, Bld 94 70 - 99 mg/dL   BUN 10 6 - 20 mg/dL   Creatinine, Ser 6.29 (H) 0.44 - 1.00 mg/dL   Calcium 8.9 8.9 - 52.8 mg/dL   Total Protein 7.1 6.5 - 8.1 g/dL   Albumin 3.8 3.5 - 5.0 g/dL   AST 25 15 - 41 U/L   ALT 25 0 - 44 U/L   Alkaline Phosphatase 102 38 - 126 U/L   Total Bilirubin 0.6 0.3 - 1.2 mg/dL   GFR, Estimated >41 >32 mL/min   Anion gap 5 5 - 15  CBC     Status: Abnormal   Collection Time: 06/03/22 11:26 PM  Result Value Ref Range   WBC 17.8 (H) 4.0 - 10.5 K/uL   RBC 4.16 3.87 - 5.11 MIL/uL   Hemoglobin 12.5 12.0 - 15.0 g/dL   HCT 44.0 10.2 - 72.5 %   MCV 86.8 80.0 - 100.0 fL    MCH 30.0 26.0 - 34.0 pg   MCHC 34.6 30.0 - 36.0 g/dL   RDW 36.6 44.0 - 34.7 %   Platelets 313 150 - 400 K/uL   nRBC 0.0 0.0 - 0.2 %  CBC     Status: Abnormal   Collection Time: 06/04/22  5:30 AM  Result Value Ref Range   WBC 13.6 (H) 4.0 - 10.5 K/uL   RBC 3.85 (L) 3.87 - 5.11 MIL/uL   Hemoglobin 11.4 (L) 12.0 - 15.0 g/dL   HCT 42.5 (L) 95.6 - 38.7 %   MCV 84.9 80.0 - 100.0 fL   MCH 29.6 26.0 - 34.0 pg   MCHC 34.9 30.0 - 36.0 g/dL   RDW 56.4 33.2 - 95.1 %   Platelets 277 150 - 400 K/uL   nRBC 0.0 0.0 - 0.2 %    Cultures: Results for orders placed or performed in visit on 08/01/19  Microscopic Examination     Status: Abnormal   Collection Time: 08/01/19  1:21 PM   URINE  Result Value Ref Range Status   WBC, UA 6-10 (A) 0 - 5 /hpf Final   RBC, Urine 0-2 0 - 2 /hpf Final   Epithelial Cells (non renal) 0-10 0 - 10 /hpf Final   Bacteria, UA Moderate (A) None seen/Few Final  Urine Culture, Reflex     Status: Abnormal   Collection Time: 08/01/19  1:21 PM   URINE  Result Value Ref Range Status   Urine Culture, Routine Final report (A)  Final   Organism ID, Bacteria Proteus mirabilis (A)  Final    Comment: Greater than 100,000 colony forming units per mL   ORGANISM ID, BACTERIA Comment  Final    Comment: Mixed urogenital flora 25,000-50,000 colony forming units per mL    Antimicrobial Susceptibility Comment  Final    Comment:       ** S = Susceptible; I = Intermediate; R = Resistant **                    P = Positive; N = Negative             MICS are expressed in micrograms per mL    Antibiotic                 RSLT#1    RSLT#2    RSLT#3    RSLT#4 Amoxicillin/Clavulanic Acid    S Ampicillin                     S Cefepime                       S Ceftriaxone                    S Cefuroxime                     S Ciprofloxacin                  S Ertapenem                      S Gentamicin                     S Levofloxacin                   S Meropenem                       S Nitrofurantoin                 R Piperacillin/Tazobactam        S Tetracycline                   R Tobramycin                     S Trimethoprim/Sulfa             S     Imaging: US PELVIC COMPLETE WITH TRANSVAGINAL  Result Date: 06/04/2022 CLINICAL DATA:  [id number]. Presents with vaginal bleeding following a D and C 4 days ago. LMP 02/27/2022. EXAM: TRANSABDOMINAL AND TRANSVAGINAL ULTRASOUND OF PELVIS COLOR DOPPLER ULTRASOUND OF OVARIES TECHNIQUE: Both transabdominal and transvaginal ultrasound examinations of the pelvis were performed. Transabdominal technique was performed for global imaging of the pelvis including uterus, ovaries, adnexal regions, and pelvic cul-de-sac. It was necessary to proceed with endovaginal  exam following the transabdominal exam to visualize the ovaries, endometrium and adnexal areas better. Color Doppler ultrasound was utilized to evaluate blood flow to the ovaries. COMPARISON:  None Available. FINDINGS: Uterus Measurements: 9.2 x 5.8 x 6.5 cm = volume: 179.7 mL. No fibroids or other mass visualized. The cervix is closed measuring 3.3 cm length. Endometrium Thickness: 10.6 mm in the distal cavity. In the proximal uterine cavity there is a 3.4 x 1.7 x 2.4 cm echogenic heterogeneous avascular mass which is most likely a blood clot. Right ovary Measurements: 3.0 x 2.8 x 2.5 cm = volume: 11 mL. Normal appearance/no adnexal mass. Left ovary Measurements: 3.5 x 2.4 x 2.5 cm = volume: 10.9 mL. Normal appearance/no adnexal mass. Color doppler evaluation of both ovaries demonstrates normal appearing color flow. Other findings There is minimal anechoic pelvic cul-de-sac fluid, nonspecific but usually physiologic at this age. IMPRESSION: 1. 3.4 x 1.7 x 2.4 cm echogenic avascular mass in the proximal uterine cavity, most likely a blood clot. Retained products of conception is possible but would usually show color flow. 2. Minimal anechoic free fluid. 3. No evidence of ovarian torsion or  mass. Electronically Signed   By: Almira Bar M.D.   On: 06/04/2022 00:16     Assessment   25 y.o. [facility] Day: 1 admitted for observation throughout the night for a gush of fluid and heavy vaginal bleeding s/p D&C on 05/30/22. Upon assessment today bleeding is minimal  uterus is non tender to palpation and vitals are WNL.   Plan   1. Start Doxycycline 100 mg BID for 7 days 2.Return to lab for betahcg on 06/06/22 3. F/u with Dr Dalbert Garnet on 06/08/22   Chari Manning, CNM Certified Nurse Midwife [facility]

## 2022-06-04 NOTE — ED Provider Notes (Signed)
[facility] Provider Note    Event Date/Time   First MD Initiated Contact with Patient 06/04/22 0034     (approximate)   History   Vaginal Bleeding   HPI  Yaslyn B Ostrosky is a 26 y.o. female G1, P0 with history of anxiety, OCD who presents to the emergency department with complaints of vaginal bleeding.  Patient underwent D&C for a missed abortion on 05/30/2022 with Dr. Dalbert Garnet.  Patient was approximately 9 weeks.  She states she was at the grocery store today and walking around when she felt a "burst" and then felt liquid coming out of her vagina.  Went to the bathroom and states she had soaked through a pad, her underwear and her pants with a clear fluid with foul odor.  States afterwards she started having vaginal bleeding and is now passing clots.  She states she has soaking more than a pad an hour and called the OB/GYN on-call who recommended she come to the emergency department.  She states she has been bleeding since her D&C but today was much heavier.  No other abnormal discharge and odor has resolved.  No fevers, chills, nausea, vomiting, diarrhea, dysuria, abdominal pain.   History provided by patient and husband.    Past Medical History:  Diagnosis Date   Anxiety    GERD (gastroesophageal reflux disease)    OCD (obsessive compulsive disorder)     Past Surgical History:  Procedure Laterality Date   DILATION AND EVACUATION N/A 05/30/2022   Procedure: SUCTION D&C;  Surgeon: Christeen Douglas, MD;  Location: [facility] ORS;  Service: Gynecology;  Laterality: N/A;   WISDOM TOOTH EXTRACTION      MEDICATIONS:  Prior to Admission medications   Medication Sig Start Date End Date Taking? Authorizing Provider  ARIPiprazole (ABILIFY) 2 MG tablet Take 2 mg by mouth at bedtime.    [provider]  Cholecalciferol (VITAMIN D-3 PO) Take 1,000 Units by mouth at bedtime.    [provider]  docusate sodium (COLACE) 100 MG capsule Take 1 capsule (100  mg total) by mouth daily as needed for mild constipation. To keep stools soft 05/30/22   Christeen Douglas, MD  escitalopram (LEXAPRO) 10 MG tablet Take 10 mg by mouth at bedtime.    [provider]  ondansetron (ZOFRAN-ODT) 4 MG disintegrating tablet Take 1 tablet (4 mg total) by mouth every 8 (eight) hours as needed for nausea or vomiting. 05/30/22   Christeen Douglas, MD    Physical Exam   Triage Vital Signs: ED Triage Vitals  Enc Vitals Group     BP 06/03/22 2315 (!) 144/91     Pulse Rate 06/03/22 2315 (!) 109     Resp 06/03/22 2315 16     Temp 06/03/22 2315 98 F (36.7 C)     Temp Source 06/03/22 2315 Oral     SpO2 06/03/22 2315 100 %     Weight 06/03/22 2320 252 lb (114.3 kg)     Height 06/03/22 2320 5\' 1"  (1.549 m)     Head Circumference --      Peak Flow --      Pain Score 06/03/22 2320 0     Pain Loc --      Pain Edu? --      Excl. in GC? --     Most recent vital signs: Vitals:   06/03/22 2315  BP: (!) 144/91  Pulse: (!) 109  Resp: 16  Temp: 98 F (36.7 C)  SpO2: 100%    CONSTITUTIONAL: Alert and oriented and responds appropriately to questions. Well-appearing; well-nourished HEAD: Normocephalic, atraumatic EYES: Conjunctivae clear, pupils appear equal, sclera nonicteric ENT: normal nose; moist mucous membranes NECK: Supple, normal ROM CARD: Regular and slightly tachycardic; S1 and S2 appreciated; no murmurs, no clicks, no rubs, no gallops RESP: Normal chest excursion without splinting or tachypnea; breath sounds clear and equal bilaterally; no wheezes, no rhonchi, no rales, no hypoxia or respiratory distress, speaking full sentences ABD/GI: Normal bowel sounds; non-distended; soft, non-tender, no rebound, no guarding, no peritoneal signs GU:  Normal external genitalia. No lesions, rashes noted.  Patient has moderate dark red vaginal bleeding with small clots on pelvic exam.  Cervix is slightly open.  No abnormal discharge otherwise or odor.  Bimanual exam  deferred.  Chaperone present. BACK: The back appears normal EXT: Normal ROM in all joints; no deformity noted, no edema; no cyanosis SKIN: Normal color for age and race; warm; no rash on exposed skin NEURO: Moves all extremities equally, normal speech PSYCH: The patient's mood and manner are appropriate.   ED Results / Procedures / Treatments   LABS: (all labs ordered are listed, but only abnormal results are displayed) Labs Reviewed  COMPREHENSIVE METABOLIC PANEL - Abnormal; Notable for the following components:      Result Value   Creatinine, Ser 1.24 (*)    All other components within normal limits  CBC - Abnormal; Notable for the following components:   WBC 17.8 (*)    All other components within normal limits  HCG, QUANTITATIVE, PREGNANCY - Abnormal; Notable for the following components:   hCG, Beta Chain, Quant, S 816 (*)    All other components within normal limits     EKG:   RADIOLOGY: My personal review and interpretation of imaging: Pelvic ultrasound shows large clot within the uterus that could be retained products of conception.  I have personally reviewed all radiology reports.   US PELVIC COMPLETE WITH TRANSVAGINAL  Result Date: 06/04/2022 CLINICAL DATA:  [id number]. Presents with vaginal bleeding following a D and C 4 days ago. LMP 02/27/2022. EXAM: TRANSABDOMINAL AND TRANSVAGINAL ULTRASOUND OF PELVIS COLOR DOPPLER ULTRASOUND OF OVARIES TECHNIQUE: Both transabdominal and transvaginal ultrasound examinations of the pelvis were performed. Transabdominal technique was performed for global imaging of the pelvis including uterus, ovaries, adnexal regions, and pelvic cul-de-sac. It was necessary to proceed with endovaginal exam following the transabdominal exam to visualize the ovaries, endometrium and adnexal areas better. Color Doppler ultrasound was utilized to evaluate blood flow to the ovaries. COMPARISON:  None Available. FINDINGS: Uterus Measurements: 9.2 x 5.8 x 6.5 cm  = volume: 179.7 mL. No fibroids or other mass visualized. The cervix is closed measuring 3.3 cm length. Endometrium Thickness: 10.6 mm in the distal cavity. In the proximal uterine cavity there is a 3.4 x 1.7 x 2.4 cm echogenic heterogeneous avascular mass which is most likely a blood clot. Right ovary Measurements: 3.0 x 2.8 x 2.5 cm = volume: 11 mL. Normal appearance/no adnexal mass. Left ovary Measurements: 3.5 x 2.4 x 2.5 cm = volume: 10.9 mL. Normal appearance/no adnexal mass. Color doppler evaluation of both ovaries demonstrates normal appearing color flow. Other findings There is minimal anechoic pelvic cul-de-sac fluid, nonspecific but usually physiologic at this age. IMPRESSION: 1. 3.4 x 1.7 x 2.4 cm echogenic avascular mass in the proximal uterine cavity, most likely a blood clot. Retained products of conception is possible but would usually show color flow. 2. Minimal anechoic free fluid.  3. No evidence of ovarian torsion or mass. Electronically Signed   By: Almira Bar M.D.   On: 06/04/2022 00:16     PROCEDURES:  Critical Care performed: No     Procedures    IMPRESSION / MDM / ASSESSMENT AND PLAN / ED COURSE  I reviewed the triage vital signs and the nursing notes.    Patient here with increased bleeding and passing clots now soaking through a pad an hour per her report after a D&C 5 days ago for nonviable pregnancy.  Patient was approximately [redacted] weeks pregnant at the time of the [facility].     DIFFERENTIAL DIAGNOSIS (includes but not limited to):   Retained products of conception, endometritis, UTI, continued bleeding after D&C, anemia   Patient's presentation is most consistent with acute presentation with potential threat to life or bodily function.   PLAN: We will obtain CBC, BMP, hCG, transvaginal ultrasound.   MEDICATIONS GIVEN IN ED: Medications  sodium chloride 0.9 % bolus 1,000 mL (has no administration in time range)     ED COURSE: Patient's labs show  leukocytosis of 17,000.  This may be reactive.  She states she did have a foul odor coming from her vagina when she soaks a pad initially with clear fluid but this has resolved.  No fevers, chills and absolutely no abdominal tenderness on exam.  No abnormal vaginal discharge or odor on pelvic exam.  Hemoglobin is stable at 12.5.  Platelet count normal.  She has a slight bump in her creatinine to 1.24 compared to normal creatinine in 2022.  Will give IV fluids for mild AKI.  Normal electrolytes.  hCG is still 816 and was 2161 on 06/01/2022.  Transvaginal ultrasound reviewed and interpreted by myself as well as the radiologist and shows a large clot within the uterus without blood flow that is likely just a clot but retained products of conception cannot be ruled out.  No other acute abnormality seen.  Patient reports she is still bleeding heavily where she is soaking through more than a pad an hour but no sign of hemorrhage here on exam and she is slightly tachycardic but otherwise hemodynamically stable.  Will discuss with OB/GYN on-call for [facility] clinic for further recommendations.  We will keep her n.p.o. at this time.   CONSULTS: Discussed with Dr. Feliberto Gottron with OB/GYN.  Appreciate his assistance with this patient.  He will send his midwife down to see the patient in the ED and get her admitted to the hospital.  Patient comfortable with this plan.   OUTSIDE RECORDS REVIEWED: Reviewed previous note with Dr. Dalbert Garnet on 05/30/2022 for D&C due to missed abortion.       FINAL CLINICAL IMPRESSION(S) / ED DIAGNOSES   Final diagnoses:  Abnormal vaginal bleeding  AKI (acute kidney injury) (HCC)     Rx / DC Orders   ED Discharge Orders     None        Note:  This document was prepared using Dragon voice recognition software and may include unintentional dictation errors.   Audrie Kuri, Layla Maw, DO 06/04/22 0110

## 2022-06-04 NOTE — Plan of Care (Signed)
Transferred to Room 345. Alert and oriented with quiet affect. Color Pale, Skin w&d. BBS clear.  Positive Pedal Pulses equal and strong with cap. Refill < 3 seconds Peri-Pad has small amount of drying serosang. Drainage. Denies pain at this time.

## 2022-06-04 NOTE — Discharge Instructions (Signed)
  Discharge instructions:   Call office if you have any of the following:  headache, visual changes, fever >101.0 F, chills, excessive vaginal bleeding, incision drainage or problems, leg pain or redness, depression or any other concerns.    No intercourse or tampons for 6 weeks.  No swimming pools, hot tubs or tub baths- showers only.    You should not soak through more than 1 pad in 1 hour.

## 2022-06-04 NOTE — Consult Note (Cosign Needed Addendum)
Consult History and Physical   SERVICE: Gynecology   Patient Name: Paige Williamson Patient MRN:   [id number]  CC: Heavy vaginal bleeding, soaking 1 pad an hour.   NWG:NFAO B Bodenheimer is a 26 y.o. female G1, P0 with history of anxiety, OCD who presents to the emergency department with complaints of vaginal bleeding.  Patient underwent D&C for a missed abortion on 05/30/2022 with Dr. Dalbert Garnet.  Patient was approximately 9 weeks.  She states she was at the grocery store today and walking around when she felt a "burst" and then felt liquid coming out of her vagina.  Went to the bathroom and states she had soaked through a pad, her underwear and her pants with a clear fluid with foul odor.  States afterwards she started having vaginal bleeding and is now passing clots.  She states she has soaking more than a pad an hour and called the OB/GYN on-call who recommended she come to the emergency department.  She states she has been bleeding since her D&C but today was much heavier.  No other abnormal discharge and odor has resolved.  No fevers, chills, nausea, vomiting, diarrhea, dysuria, abdominal pain.   Review of Systems: positives in bold GEN:   fevers, chills, weight changes, appetite changes, fatigue, night sweats HEENT:  HA, vision changes, hearing loss, congestion, rhinorrhea, sinus pressure, dysphagia CV:   CP, palpitations PULM:  SOB, cough GI:  abd pain, N/V/D/C GU:  dysuria, urgency, frequency MSK:  arthralgias, myalgias, back pain, swelling SKIN:  rashes, color changes, pallor NEURO:  numbness, weakness, tingling, seizures, dizziness, tremors PSYCH:  depression, anxiety, behavioral problems, confusion  HEME/LYMPH:  easy bruising or bleeding ENDO:  heat/cold intolerance  Past Obstetrical History: OB History     Gravida  1   Para      Term      Preterm      AB      Living         SAB      IAB      Ectopic      Multiple      Live Births              Past  Gynecologic History: No LMP recorded (lmp unknown). Menstrual frequency Q 32 days lasting 4 days requiring 2-3 pads/day,    Past Medical History: Past Medical History:  Diagnosis Date   Anxiety    GERD (gastroesophageal reflux disease)    OCD (obsessive compulsive disorder)     Past Surgical History:   Past Surgical History:  Procedure Laterality Date   DILATION AND EVACUATION N/A 05/30/2022   Procedure: SUCTION D&C;  Surgeon: Christeen Douglas, MD;  Location: [facility] ORS;  Service: Gynecology;  Laterality: N/A;   WISDOM TOOTH EXTRACTION      Family History:  family history includes Cancer in her paternal grandfather; Depression in her mother; Hyperlipidemia in her father.  Social History:  Social History   Socioeconomic History   Marital status: Married    Spouse name: Greig Castilla   Number of children: 0   Years of education: Not on file   Highest education level: Not on file  Occupational History   Not on file  Tobacco Use   Smoking status: Never   Smokeless tobacco: Never  Vaping Use   Vaping Use: Never used  Substance and Sexual Activity   Alcohol use: Yes    Comment: socially   Drug use: No   Sexual activity: Yes  Other Topics  Concern   Not on file  Social History Narrative   Not on file   Social Determinants of Health   Financial Resource Strain: Not on file  Food Insecurity: Not on file  Transportation Needs: Not on file  Physical Activity: Not on file  Stress: Not on file  Social Connections: Not on file  Intimate Partner Violence: Not on file    Home Medications:  Medications reconciled in EPIC  No current facility-administered medications on file prior to encounter.   Current Outpatient Medications on File Prior to Encounter  Medication Sig Dispense Refill   ARIPiprazole (ABILIFY) 2 MG tablet Take 2 mg by mouth at bedtime.     Cholecalciferol (VITAMIN D-3 PO) Take 1,000 Units by mouth at bedtime.     escitalopram (LEXAPRO) 10 MG tablet Take 10 mg by  mouth at bedtime.     docusate sodium (COLACE) 100 MG capsule Take 1 capsule (100 mg total) by mouth daily as needed for mild constipation. To keep stools soft (Patient not taking: Reported on 06/04/2022) 30 capsule 0   ondansetron (ZOFRAN-ODT) 4 MG disintegrating tablet Take 1 tablet (4 mg total) by mouth every 8 (eight) hours as needed for nausea or vomiting. (Patient not taking: Reported on 06/04/2022) 20 tablet 0    Allergies:  Allergies  Allergen Reactions   Shellfish Allergy     Physical Exam:  Temp:  [98 F (36.7 C)-98.5 F (36.9 C)] 98 F (36.7 C) (07/30 0754) Pulse Rate:  [86-109] 86 (07/30 0754) Resp:  [16-20] 20 (07/30 0754) BP: (129-144)/(76-91) 130/81 (07/30 0754) SpO2:  [99 %-100 %] 99 % (07/30 0754) Weight:  [114.3 kg] 114.3 kg (07/29 2320)   General Appearance:  Well developed, well nourished, no acute distress, alert and oriented, cooperative and appears stated age 89:  Normocephalic atraumatic, extraocular movements intact, moist mucous membranes, neck supple with midline trachea and thyroid without masses Cardiovascular:  Normal S1/S2, regular rate and rhythm, no murmurs, 2+ distal pulses Pulmonary:  clear to auscultation, no wheezes, rales or rhonchi, symmetric air entry, good air exchange Abdomen:  Bowel sounds present, soft, nontender, nondistended, no abnormal masses or organomegaly, no epigastric pain Back: inspection of back is normal Extremities:  extremities normal, no tenderness, atraumatic, no cyanosis or edema Skin:  normal coloration and turgor, no rashes, no suspicious skin lesions noted  Neurologic:  Cranial nerves 2-12 grossly intact, grossly equal strength and muscle tone, normal speech, no focal findings or movement disorder noted. Psychiatric:  Normal mood and affect, appropriate, no AH/VH Pelvic:  NEFG, no vulvar masses or lesions, normal vaginal mucosa, small amount vaginal bleeding uterus, no adnexal masses appreciated,  Labs/Studies:   CBC  and Coags:  Lab Results  Component Value Date   WBC 13.6 (H) 06/04/2022   NEUTOPHILPCT 70 08/01/2019   HGB 11.4 (L) 06/04/2022   HCT 32.7 (L) 06/04/2022   MCV 84.9 06/04/2022   PLT 277 06/04/2022   CMP:  Lab Results  Component Value Date   NA 135 06/03/2022   K 3.8 06/03/2022   CL 103 06/03/2022   CO2 27 06/03/2022   BUN 10 06/03/2022   CREATININE 1.24 (H) 06/03/2022   CREATININE 0.69 08/01/2019   CREATININE 0.69 05/15/2018   PROT 7.1 06/03/2022   BILITOT 0.6 06/03/2022   ALT 25 06/03/2022   AST 25 06/03/2022   ALKPHOS 102 06/03/2022   Other Labs: See above  TVUS:   Other Imaging: US PELVIC COMPLETE WITH TRANSVAGINAL  Result Date: 06/04/2022 CLINICAL  DATA:  [id number]. Presents with vaginal bleeding following a D and C 4 days ago. LMP 02/27/2022. EXAM: TRANSABDOMINAL AND TRANSVAGINAL ULTRASOUND OF PELVIS COLOR DOPPLER ULTRASOUND OF OVARIES TECHNIQUE: Both transabdominal and transvaginal ultrasound examinations of the pelvis were performed. Transabdominal technique was performed for global imaging of the pelvis including uterus, ovaries, adnexal regions, and pelvic cul-de-sac. It was necessary to proceed with endovaginal exam following the transabdominal exam to visualize the ovaries, endometrium and adnexal areas better. Color Doppler ultrasound was utilized to evaluate blood flow to the ovaries. COMPARISON:  None Available. FINDINGS: Uterus Measurements: 9.2 x 5.8 x 6.5 cm = volume: 179.7 mL. No fibroids or other mass visualized. The cervix is closed measuring 3.3 cm length. Endometrium Thickness: 10.6 mm in the distal cavity. In the proximal uterine cavity there is a 3.4 x 1.7 x 2.4 cm echogenic heterogeneous avascular mass which is most likely a blood clot. Right ovary Measurements: 3.0 x 2.8 x 2.5 cm = volume: 11 mL. Normal appearance/no adnexal mass. Left ovary Measurements: 3.5 x 2.4 x 2.5 cm = volume: 10.9 mL. Normal appearance/no adnexal mass. Color doppler evaluation of both  ovaries demonstrates normal appearing color flow. Other findings There is minimal anechoic pelvic cul-de-sac fluid, nonspecific but usually physiologic at this age. IMPRESSION: 1. 3.4 x 1.7 x 2.4 cm echogenic avascular mass in the proximal uterine cavity, most likely a blood clot. Retained products of conception is possible but would usually show color flow. 2. Minimal anechoic free fluid. 3. No evidence of ovarian torsion or mass. Electronically Signed   By: Almira Bar M.D.   On: 06/04/2022 00:16     Assessment / Plan:   Ladine B Buckholtz is a 26 y.o. No obstetric history on file. who presents with heavy vaginal bleeding. Discussed with Patient and spouse the Plan of care. Dr Schermerhorn aware.  1. Admit for observation on PP unit 2. 1 gm TXA ordered 3. PO Methergine  4. NPO 5. IV fluids for hydration   Thank you for the opportunity to be involved with this patient's care.  ----- Chari Manning, CNM Midwife [facility], Department of OB/GYN [facility]

## 2022-09-14 ENCOUNTER — Emergency Department
Admission: EM | Admit: 2022-09-14 | Discharge: 2022-09-14 | Disposition: A | Discharge status: Home / Self Care | Payer: BC Managed Care – PPO | Attending: Emergency Medicine | Admitting: Emergency Medicine

## 2022-09-14 DIAGNOSIS — F419 Anxiety disorder, unspecified: Secondary | ICD-10-CM | POA: Diagnosis present

## 2022-09-14 DIAGNOSIS — F4321 Adjustment disorder with depressed mood: Secondary | ICD-10-CM

## 2022-09-14 DIAGNOSIS — F4323 Adjustment disorder with mixed anxiety and depressed mood: Secondary | ICD-10-CM | POA: Diagnosis not present

## 2022-09-14 DIAGNOSIS — F32A Depression, unspecified: Secondary | ICD-10-CM | POA: Diagnosis present

## 2022-09-14 DIAGNOSIS — Z008 Encounter for other general examination: Secondary | ICD-10-CM | POA: Diagnosis present

## 2022-09-14 LAB — COMPREHENSIVE METABOLIC PANEL
ALT: 51 U/L — ABNORMAL HIGH (ref 0–44)
AST: 37 U/L (ref 15–41)
Albumin: 4.1 g/dL (ref 3.5–5.0)
Alkaline Phosphatase: 112 U/L (ref 38–126)
Anion gap: 6 (ref 5–15)
BUN: 10 mg/dL (ref 6–20)
CO2: 25 mmol/L (ref 22–32)
Calcium: 9.3 mg/dL (ref 8.9–10.3)
Chloride: 107 mmol/L (ref 98–111)
Creatinine, Ser: 0.74 mg/dL (ref 0.44–1.00)
GFR, Estimated: >60 mL/min (ref >60)
Glucose, Bld: 106 mg/dL — ABNORMAL HIGH (ref 70–99)
Potassium: 3.9 mmol/L (ref 3.5–5.1)
Sodium: 138 mmol/L (ref 135–145)
Total Bilirubin: 0.7 mg/dL (ref 0.3–1.2)
Total Protein: 8.2 g/dL — ABNORMAL HIGH (ref 6.5–8.1)

## 2022-09-14 LAB — CBC
HCT: 39.9 % (ref 36.0–46.0)
Hemoglobin: 13.5 g/dL (ref 12.0–15.0)
MCH: 28.6 pg (ref 26.0–34.0)
MCHC: 33.8 g/dL (ref 30.0–36.0)
MCV: 84.5 fL (ref 80.0–100.0)
Platelets: 375 10*3/uL (ref 150–400)
RBC: 4.72 MIL/uL (ref 3.87–5.11)
RDW: 12.2 % (ref 11.5–15.5)
WBC: 14.7 10*3/uL — ABNORMAL HIGH (ref 4.0–10.5)
nRBC: 0 % (ref 0.0–0.2)

## 2022-09-14 LAB — ETHANOL: Alcohol, Ethyl (B): <10 mg/dL (ref <10)

## 2022-09-14 LAB — ACETAMINOPHEN LEVEL: Acetaminophen (Tylenol), Serum: <10 ug/mL — ABNORMAL LOW (ref 10–30)

## 2022-09-14 LAB — SALICYLATE LEVEL: Salicylate Lvl: <7 mg/dL — ABNORMAL LOW (ref 7.0–30.0)

## 2022-09-14 NOTE — ED Notes (Signed)
Discharge instructions reviewed with pt and husband. Both verbalized understanding. Pt belongings returned to pt. Pt ambulatory to waiting area without difficulty.

## 2022-09-14 NOTE — ED Provider Triage Note (Signed)
Emergency Medicine Provider Triage Evaluation Note  Paige Williamson, a 26 y.o. female  was evaluated in triage.  Pt complains of expressing grief in the presence of her husband.  Husband recently died with a miscarriage in 2023-06-08.  The patient today expressed some evaluation by saying that she would be ok if she died. Her husband notified his counselor who strongly recommended patient come to the ED for evaluation voluntarily, or she would be forced to send the sheriff's office to the house to have her IVC.  Patient denies any SI or HI.  She does endorse oceans related to her best friend who is also pregnant and recently delivered.  Review of Systems  Positive: grief Negative: SI/HI  Physical Exam  There were no vitals taken for this visit. Gen:   Awake, no distress tearful Resp:  Normal effort  MSK:   Moves extremities without difficulty  Other:    Medical Decision Making  Medically screening exam initiated at 5:30 PM.  Appropriate orders placed.  RENNEE Williamson was informed that the remainder of the evaluation will be completed by another provider, this initial triage assessment does not replace that evaluation, and the importance of remaining in the ED until their evaluation is complete.  Patient to the ED voluntarily at the advice of her husband's therapist, related to some comments she made today.  She admits to grief and mixed emotions related to her miscarriage and her best girlfriends recent delivery.   Paige Hoard, PA-C 09/14/22 1733

## 2022-09-14 NOTE — ED Notes (Signed)
Pt husband has decided to take entirety of pt belongings with him upon leaving the ED

## 2022-09-14 NOTE — ED Notes (Signed)
Received call from pt's psychiatrist. Per Psychiatrist pt has not been taking medications, pt has texted thoughts of wanting to be in [place] with baby. Psychiatrist stated she is the primary pt's psychiatrist. Also stated she is out of [place] and thought pt was going to [facility]. Psychiatrist is very concerned for pt's well being.  Also states pt went out and brought home new puppy. Pt stating she is going to divorce husband now. Pt may be going thru manic episode. Psychiatrist is not sure but feels pt needs to be kept here and did tell pt that if she didn't go voluntary then she would IVC her.

## 2022-09-14 NOTE — Discharge Instructions (Signed)
Please seek medical attention and help for any thoughts about wanting to harm yourself, harm others, any concerning change in behavior, severe depression, inappropriate drug use or any other new or concerning symptoms. ° °

## 2022-09-14 NOTE — ED Triage Notes (Addendum)
Pt sts that she has a therapist and that she recently miscarriage back in July. Pt made a comment to her husband that she would not care if she would to die today. Husband told his therapist and his therapist was going to IVC pt if she did not come to the ED.  Pt friend had a baby Tuesday and they were suppose to have babies together and pt is very upset. Pt sts that her own therapist says that she is in the grieving process. Pt denies any SI/HI

## 2022-09-14 NOTE — Consult Note (Signed)
[facility] Face-to-Face Psychiatry Consult   Reason for Consult: Psychiatric Evaluation  Referring Physician: Dr. Derrill Kay Patient Identification: Paige Williamson MRN:  [id number] Principal Diagnosis: <principal problem not specified> Diagnosis:  Active Problems:   Anxiety and depression   Total Time spent with patient: 1 hour  Subjective: "My therapist said I could come at will or she will commit me." Paige Williamson is a 26 y.o. female patient presented to [facility] ED via POV with her husband at her side. The patient voiced that she miscarried when she was [redacted] weeks pregnant. She reports that she and her friend were pregnant around the same time. Her friend was eight weeks ahead of her. The patient shared that her friend had her baby this week, and she had been thinking more about losing her child. She shared that when she went to her therapy appointment today, her husband shared some things the patient had voiced. Her therapist told her she could come to the ED voluntarily, or her therapist would involuntarily have her committed. The patient and her husband decided to come in voluntarily.  This provider saw The patient face-to-face; the chart was reviewed, and consulted with Dr. Derrill Kay on 09/14/2022 due to the patient's care. It was discussed with the EDP that the patient does not meet the criteria to be admitted to the psychiatric inpatient unit.  On evaluation, the patient is alert and oriented x 4, anxious, sad but calm,  cooperative, and mood-congruent with affect. The patient does not appear to be responding to internal or external stimuli. Neither is the patient presenting with any delusional thinking. The patient denies auditory or visual hallucinations. The patient denies any suicidal, homicidal, or self-harm ideations. The patient is not presenting with any psychotic or paranoid behaviors. During an encounter with the patient, she could answer questions appropriately. Collateral was obtained from the  patient's husband, Greig Castilla [phone]), who shared that he and his wife continue to grieve over the loss of their child. During this period, they will experience many different emotions. He shared, "My wife is not a danger to herself or anyone. My wife shares her feelings, and sometimes she says things, and I know she is only expressing her feelings, and other times she is fine." Greig Castilla stated, "I was told to bring her to the hospital, and I did. I did not want any more drama today. It has been a long day for both of [place]."    HPI: Per Dr. Derrill Kay, THOMASINE Williamson is a 26 y.o. female who presents to the emergency department today at the request of her therapist because of concerns for depression.  The patient had a miscarriage earlier this year and apparently had a friend who just had a baby.  Because of this she states that she is having more grief.  At the time my exam she denies any thoughts about wanting to hurt herself.   Past Psychiatric History:  Anxiety OCD (obsessive compulsive disorder)  Risk to Self:   Risk to Others:   Prior Inpatient Therapy:   Prior Outpatient Therapy:    Past Medical History:  Past Medical History:  Diagnosis Date   Anxiety    GERD (gastroesophageal reflux disease)    OCD (obsessive compulsive disorder)     Past Surgical History:  Procedure Laterality Date   DILATION AND EVACUATION N/A 05/30/2022   Procedure: SUCTION D&C;  Surgeon: Christeen Douglas, MD;  Location: [facility] ORS;  Service: Gynecology;  Laterality: N/A;   WISDOM TOOTH EXTRACTION  Family History:  Family History  Problem Relation Age of Onset   Depression Mother    Hyperlipidemia Father    Cancer Paternal Grandfather        Liver   Family Psychiatric  History: History reviewed. No pertinent family psychiatric history Social History:  Social History   Substance and Sexual Activity  Alcohol Use Yes   Comment: socially     Social History   Substance and Sexual Activity  Drug Use No     Social History   Socioeconomic History   Marital status: Married    Spouse name: Greig Castilla   Number of children: 0   Years of education: Not on file   Highest education level: Not on file  Occupational History   Not on file  Tobacco Use   Smoking status: Never   Smokeless tobacco: Never  Vaping Use   Vaping Use: Never used  Substance and Sexual Activity   Alcohol use: Yes    Comment: socially   Drug use: No   Sexual activity: Yes  Other Topics Concern   Not on file  Social History Narrative   Not on file   Social Determinants of Health   Financial Resource Strain: Not on file  Food Insecurity: Not on file  Transportation Needs: Not on file  Physical Activity: Not on file  Stress: Not on file  Social Connections: Not on file   Additional Social History:    Allergies:   Allergies  Allergen Reactions   Shellfish Allergy     Labs:  Results for orders placed or performed during the hospital encounter of 09/14/22 (from the past 48 hour(s))  Comprehensive metabolic panel     Status: Abnormal   Collection Time: 09/14/22  9:45 PM  Result Value Ref Range   Sodium 138 135 - 145 mmol/L   Potassium 3.9 3.5 - 5.1 mmol/L   Chloride 107 98 - 111 mmol/L   CO2 25 22 - 32 mmol/L   Glucose, Bld 106 (H) 70 - 99 mg/dL    Comment: Glucose reference range applies only to samples taken after fasting for at least 8 hours.   BUN 10 6 - 20 mg/dL   Creatinine, Ser 4.33 0.44 - 1.00 mg/dL   Calcium 9.3 8.9 - 29.5 mg/dL   Total Protein 8.2 (H) 6.5 - 8.1 g/dL   Albumin 4.1 3.5 - 5.0 g/dL   AST 37 15 - 41 U/L   ALT 51 (H) 0 - 44 U/L   Alkaline Phosphatase 112 38 - 126 U/L   Total Bilirubin 0.7 0.3 - 1.2 mg/dL   GFR, Estimated >18 >84 mL/min    Comment: (NOTE) Calculated using the CKD-EPI Creatinine Equation (2021)    Anion gap 6 5 - 15    Comment: Performed at [facility], [address] [place], [place] [address]  Ethanol     Status: None   Collection Time: 09/14/22   9:45 PM  Result Value Ref Range   Alcohol, Ethyl (B) <10 <10 mg/dL    Comment: (NOTE) Lowest detectable limit for serum alcohol is 10 mg/dL.  For medical purposes only. Performed at [facility], [address] [place], [place] [address]   Salicylate level     Status: Abnormal   Collection Time: 09/14/22  9:45 PM  Result Value Ref Range   Salicylate Lvl <7.0 (L) 7.0 - 30.0 mg/dL    Comment: Performed at [facility], [address], [place], [place] [address]  Acetaminophen level     Status: Abnormal   Collection Time: 09/14/22  9:45 PM  Result Value Ref Range   Acetaminophen (Tylenol), Serum <10 (L) 10 - 30 ug/mL    Comment: (NOTE) Therapeutic concentrations vary significantly. A range of 10-30 ug/mL  may be an effective concentration for many patients. However, some  are best treated at concentrations outside of this range. Acetaminophen concentrations >150 ug/mL at 4 hours after ingestion  and >50 ug/mL at 12 hours after ingestion are often associated with  toxic reactions.  Performed at [facility], [address] [place], [place] [address]   cbc     Status: Abnormal   Collection Time: 09/14/22  9:45 PM  Result Value Ref Range   WBC 14.7 (H) 4.0 - 10.5 K/uL   RBC 4.72 3.87 - 5.11 MIL/uL   Hemoglobin 13.5 12.0 - 15.0 g/dL   HCT 84.1 32.4 - 40.1 %   MCV 84.5 80.0 - 100.0 fL   MCH 28.6 26.0 - 34.0 pg   MCHC 33.8 30.0 - 36.0 g/dL   RDW 02.7 25.3 - 66.4 %   Platelets 375 150 - 400 K/uL   nRBC 0.0 0.0 - 0.2 %    Comment: Performed at [facility], [address] [place], [place] [address]    No current facility-administered medications for this encounter.   Current Outpatient Medications  Medication Sig Dispense Refill   acetaminophen (TYLENOL) 500 MG tablet Take 2 tablets (1,000 mg total) by mouth every 6 (six) hours as needed for mild pain or moderate pain. 30 tablet 0   ARIPiprazole (ABILIFY) 2 MG tablet Take 2 mg by  mouth at bedtime.     Cholecalciferol (VITAMIN D-3 PO) Take 1,000 Units by mouth at bedtime.     escitalopram (LEXAPRO) 10 MG tablet Take 10 mg by mouth at bedtime.     ondansetron (ZOFRAN-ODT) 4 MG disintegrating tablet Take 1 tablet (4 mg total) by mouth every 8 (eight) hours as needed for nausea or vomiting. (Patient not taking: Reported on 06/04/2022) 20 tablet 0    Musculoskeletal: Strength & Muscle Tone: within normal limits Gait & Station: normal Patient leans: N/A  Psychiatric Specialty Exam:  Presentation  General Appearance:  Appropriate for Environment  Eye Contact: Good  Speech: Clear and Coherent  Speech Volume: Normal  Handedness: Right   Mood and Affect  Mood: Depressed; Anxious  Affect: Flat; Blunt   Thought Process  Thought Processes: Coherent  Descriptions of Associations:Intact  Orientation:Full (Time, Place and Person)  Thought Content:Logical  History of Schizophrenia/Schizoaffective disorder:No data recorded Duration of Psychotic Symptoms:No data recorded Hallucinations:Hallucinations: None  Ideas of Reference:None  Suicidal Thoughts:Suicidal Thoughts: No  Homicidal Thoughts:Homicidal Thoughts: No   Sensorium  Memory: Immediate Good; Recent Good; Remote Good  Judgment: Fair  Insight: Fair   Chartered certified accountant: Fair  Attention Span: Fair  Recall: [employer] of Knowledge: Fair  Language: Fair   Psychomotor Activity  Psychomotor Activity: Psychomotor Activity: Normal   Assets  Assets: Communication Skills; Desire for Improvement; Resilience; Social Support   Sleep  Sleep: Sleep: Good Number of Hours of Sleep: 8   Physical Exam: Physical Exam Vitals and nursing note reviewed.  Constitutional:      Appearance: Normal appearance.  HENT:     Head: Normocephalic and atraumatic.     Right Ear: External ear normal.     Left Ear: External ear normal.     Nose: Nose normal.  Cardiovascular:     Rate and Rhythm: Normal rate.     Pulses: Normal pulses.  Pulmonary:     Effort: Pulmonary effort is normal.  Musculoskeletal:        General: Normal range of motion.     Cervical back: Normal range of motion and neck supple.  Neurological:     General: No focal deficit present.     Mental Status: She is alert and oriented to person, place, and time.  Psychiatric:        Attention and Perception: Attention and perception normal.        Mood and Affect: Mood is anxious and depressed. Affect is blunt and flat.        Behavior: Behavior normal. Behavior is cooperative.        Thought Content: Thought content normal.        Cognition and Memory: Cognition and memory normal.        Judgment: Judgment normal.    Review of Systems  Psychiatric/Behavioral:  Positive for depression. The patient is nervous/anxious.    Blood pressure (!) 142/88, pulse 80, temperature 98.2 F (36.8 C), temperature source Oral, resp. rate 16, weight 104.3 kg, last menstrual period 09/05/2022, SpO2 100 %, unknown if currently breastfeeding. Body mass index is 43.46 kg/m.  Treatment Plan Summary: Plan    Patient does not meet criteria for psychiatric inpatient admission  Disposition: No evidence of imminent risk to self or others at present.   Patient does not meet criteria for psychiatric inpatient admission. Supportive therapy provided about ongoing stressors. Discussed crisis plan, support from social network, calling 911, coming to the Emergency Department, and calling Suicide Hotline.  Gillermo Murdoch, NP 09/14/2022 11:24 PM

## 2022-09-14 NOTE — ED Notes (Addendum)
This nurse in room with pt and Misty Stanley, Charity fundraiser. Psych eval process explained to pt with pt voicing understanding. Pt dressed out into hospital scrubs with this nurse and Melody, EDT. Pt belongings placed in hospital belongings bag and labeled.  Belonging include: 1 orange top 1 pair black jeans 1 pair brown shoes 1 pair underwear 1 bra 1 pair earrings 1 necklace 1 hair tie  Pt cell phone, wedding band and 1 gold ring placed into specimen cup and given to pt husband

## 2022-09-14 NOTE — ED Provider Notes (Signed)
   [facility] Provider Note    Event Date/Time   First MD Initiated Contact with Patient 09/14/22 2152     (approximate)   History   Psychiatric Evaluation   HPI  Paige Williamson is a 26 y.o. female who presents to the emergency department today at the request of her therapist because of concerns for depression.  The patient had a miscarriage earlier this year and apparently had a friend who just had a baby.  Because of this she states that she is having more grief.  At the time my exam she denies any thoughts about wanting to hurt herself.     Physical Exam   Triage Vital Signs: ED Triage Vitals  Enc Vitals Group     BP 09/14/22 1732 (!) 147/98     Pulse Rate 09/14/22 1732 84     Resp 09/14/22 1732 17     Temp 09/14/22 1732 98.3 F (36.8 C)     Temp Source 09/14/22 1732 Oral     SpO2 09/14/22 1732 98 %     Weight 09/14/22 1734 230 lb (104.3 kg)     Height --      Head Circumference --      Peak Flow --      Pain Score 09/14/22 1734 0     Pain Loc --      Pain Edu? --      Excl. in GC? --     Most recent vital signs: Vitals:   09/14/22 1732  BP: (!) 147/98  Pulse: 84  Resp: 17  Temp: 98.3 F (36.8 C)  SpO2: 98%   General: Awake, alert, tearful CV:  Good peripheral perfusion.  Resp:  Normal effort.  Abd:  No distention.  Other:  Tearful. Denies SI/HI.   ED Results / Procedures / Treatments   Labs (all labs ordered are listed, but only abnormal results are displayed) Labs Reviewed  CBC - Abnormal; Notable for the following components:      Result Value   WBC 14.7 (*)    All other components within normal limits  COMPREHENSIVE METABOLIC PANEL  ETHANOL  SALICYLATE LEVEL  ACETAMINOPHEN LEVEL  URINE DRUG SCREEN, QUALITATIVE ([facility] ONLY)  POC URINE PREG, ED     EKG  None   RADIOLOGY None   PROCEDURES:  Critical Care performed: No  Procedures   MEDICATIONS ORDERED IN ED: Medications - No data to  display   IMPRESSION / MDM / ASSESSMENT AND PLAN / ED COURSE  I reviewed the triage vital signs and the nursing notes.                              Differential diagnosis includes, but is not limited to, depression, grief, SI.  Patient's presentation is most consistent with acute presentation with potential threat to life or bodily function.  Patient presented to the emergency department today because of concerns for depression and grief.  On exam patient denies any SI.  Psychiatry did evaluate patient and felt she was safe for continued outpatient management.  Patient denies any medical complaints today.   FINAL CLINICAL IMPRESSION(S) / ED DIAGNOSES   Final diagnoses:  Grief      Note:  This document was prepared using Dragon voice recognition software and may include unintentional dictation errors.    Phineas Semen, MD 09/14/22 2255

## 2022-09-15 DIAGNOSIS — F4321 Adjustment disorder with depressed mood: Secondary | ICD-10-CM

## 2023-01-05 DIAGNOSIS — E282 Polycystic ovarian syndrome: Secondary | ICD-10-CM

## 2023-01-05 HISTORY — DX: Polycystic ovarian syndrome: E28.2

## 2023-05-07 ENCOUNTER — Ambulatory Visit (INDEPENDENT_AMBULATORY_CARE_PROVIDER_SITE_OTHER): Payer: BC Managed Care – PPO

## 2023-05-07 DIAGNOSIS — Z3480 Encounter for supervision of other normal pregnancy, unspecified trimester: Secondary | ICD-10-CM | POA: Insufficient documentation

## 2023-05-07 DIAGNOSIS — Z3689 Encounter for other specified antenatal screening: Secondary | ICD-10-CM

## 2023-05-07 NOTE — Progress Notes (Signed)
New OB Intake  I connected with  Paige Williamson on 05/07/23 at  9:15 AM EDT by telephone and verified that I am speaking with the correct person using two identifiers. Nurse is located at [facility] and pt is located at work.  I explained I am completing New OB Intake today. We discussed her EDD of 12/20/2023 that is based on LMP of 03/15/2023. Pt is G2/P0010. She is transferring to [place] from [facility] but will keep appts with them until she is completely transferred so she doesn't miss anything with the baby. I reviewed her allergies, medications, Medical/Surgical/OB history, and appropriate screenings. There are no cats in the home.    Based on history, this is a/an pregnancy uncomplicated .   Patient Active Problem List   Diagnosis Date Noted   Supervision of other normal pregnancy, antepartum 05/07/2023   Grief 09/15/2022   Episode of heavy vaginal bleeding 06/04/2022   Encounter for supervision of normal pregnancy 05/03/2022   Anxiety and depression 09/03/2017    Concerns addressed today None  Delivery Plans:  Plans to deliver at [facility].  Anatomy US Pt will have her dating scan with [facility] today; adv when she finds out how far along she is to call and schedule NOB exam with [place].  Pt aware an anatomy scan will be done at 20 weeks.  Labs Discussed genetic screening with patient. Patient desires genetic testing to be drawn at new OB visit. Discussed possible labs to be drawn at new OB appointment.  COVID Vaccine Patient has had COVID vaccine.   Social Determinants of Health Food Insecurity: denies food insecurity Transportation: Patient denies transportation needs.  First visit review I reviewed new OB appt with pt. I explained she will have ob bloodwork and pap smear/pelvic exam if indicated. Explained pt will be seen by an AOB provider at first visit; encounter routed to appropriate provider.   Loran Senters, [facility] 05/07/2023  9:48 AM

## 2023-05-07 NOTE — Patient Instructions (Signed)
First Trimester of Pregnancy  The first trimester of pregnancy starts on the first day of your last menstrual period until the end of week 12. This is also called months 1 through 3 of pregnancy. Body changes during your first trimester Your body goes through many changes during pregnancy. The changes usually return to normal after your baby is born. Physical changes You may gain or lose weight. Your breasts may grow larger and hurt. The area around your nipples may get darker. Dark spots or blotches may develop on your face. You may have changes in your hair. Health changes You may feel like you might vomit (nauseous), and you may vomit. You may have heartburn. You may have headaches. You may have trouble pooping (constipation). Your gums may bleed. Other changes You may get tired easily. You may pee (urinate) more often. Your menstrual periods will stop. You may not feel hungry. You may want to eat certain kinds of food. You may have changes in your emotions from day to day. You may have more dreams. Follow these instructions at home: Medicines Take over-the-counter and prescription medicines only as told by your doctor. Some medicines are not safe during pregnancy. Take a prenatal vitamin that contains at least 600 micrograms (mcg) of folic acid. Eating and drinking Eat healthy meals that include: Fresh fruits and vegetables. Whole grains. Good sources of protein, such as meat, eggs, or tofu. Low-fat dairy products. Avoid raw meat and unpasteurized juice, milk, and cheese. If you feel like you may vomit, or you vomit: Eat 4 or 5 small meals a day instead of 3 large meals. Try eating a few soda crackers. Drink liquids between meals instead of during meals. You may need to take these actions to prevent or treat trouble pooping: Drink enough fluids to keep your pee (urine) pale yellow. Eat foods that are high in fiber. These include beans, whole grains, and fresh fruits and  vegetables. Limit foods that are high in fat and sugar. These include fried or sweet foods. Activity Exercise only as told by your doctor. Most people can do their usual exercise routine during pregnancy. Stop exercising if you have cramps or pain in your lower belly (abdomen) or low back. Do not exercise if it is too hot or too humid, or if you are in a place of great height (high altitude). Avoid heavy lifting. If you choose to, you may have sex unless your doctor tells you not to. Relieving pain and discomfort Wear a good support bra if your breasts are sore. Rest with your legs raised (elevated) if you have leg cramps or low back pain. If you have bulging veins (varicose veins) in your legs: Wear support hose as told by your doctor. Raise your feet for 15 minutes, 3-4 times a day. Limit salt in your food. Safety Wear your seat belt at all times when you are in a car. Talk with your doctor if someone is hurting you or yelling at you. Talk with your doctor if you are feeling sad or have thoughts of hurting yourself. Lifestyle Do not use hot tubs, steam rooms, or saunas. Do not douche. Do not use tampons or scented sanitary pads. Do not use herbal medicines, illegal drugs, or medicines that are not approved by your doctor. Do not drink alcohol. Do not smoke or use any products that contain nicotine or tobacco. If you need help quitting, ask your doctor. Avoid cat litter boxes and soil that is used by cats. These carry   germs that can cause harm to the baby and can cause a loss of your baby by miscarriage or stillbirth. General instructions Keep all follow-up visits. This is important. Ask for help if you need counseling or if you need help with nutrition. Your doctor can give you advice or tell you where to go for help. Visit your dentist. At home, brush your teeth with a soft toothbrush. Floss gently. Write down your questions. Take them to your prenatal visits. Where to find more  information American Pregnancy Association: americanpregnancy.org American College of Obstetricians and Gynecologists: www.acog.org Office on Women's Health: womenshealth.gov/pregnancy Contact a doctor if: You are dizzy. You have a fever. You have mild cramps or pressure in your lower belly. You have a nagging pain in your belly area. You continue to feel like you may vomit, you vomit, or you have watery poop (diarrhea) for 24 hours or longer. You have a bad-smelling fluid coming from your vagina. You have pain when you pee. You are exposed to a disease that spreads from person to person, such as chickenpox, measles, Zika virus, HIV, or hepatitis. Get help right away if: You have spotting or bleeding from your vagina. You have very bad belly cramping or pain. You have shortness of breath or chest pain. You have any kind of injury, such as from a fall or a car crash. You have new or increased pain, swelling, or redness in an arm or leg. Summary The first trimester of pregnancy starts on the first day of your last menstrual period until the end of week 12 (months 1 through 3). Eat 4 or 5 small meals a day instead of 3 large meals. Do not smoke or use any products that contain nicotine or tobacco. If you need help quitting, ask your doctor. Keep all follow-up visits. This information is not intended to replace advice given to you by your health care provider. Make sure you discuss any questions you have with your health care provider. Document Revised: 03/31/2020 Document Reviewed: 02/05/2020 Elsevier Patient Education  2024 Elsevier Inc. Commonly Asked Questions During Pregnancy  Cats: A parasite can be excreted in cat feces.  To avoid exposure you need to have another person empty the little box.  If you must empty the litter box you will need to wear gloves.  Wash your hands after handling your cat.  This parasite can also be found in raw or undercooked meat so this should also be  avoided.  Colds, Sore Throats, Flu: Please check your medication sheet to see what you can take for symptoms.  If your symptoms are unrelieved by these medications please call the office.  Dental Work: Most any dental work your dentist recommends is permitted.  X-rays should only be taken during the first trimester if absolutely necessary.  Your abdomen should be shielded with a lead apron during all x-rays.  Please notify your provider prior to receiving any x-rays.  Novocaine is fine; gas is not recommended.  If your dentist requires a note from us prior to dental work please call the office and we will provide one for you.  Exercise: Exercise is an important part of staying healthy during your pregnancy.  You may continue most exercises you were accustomed to prior to pregnancy.  Later in your pregnancy you will most likely notice you have difficulty with activities requiring balance like riding a bicycle.  It is important that you listen to your body and avoid activities that put you at a higher   risk of falling.  Adequate rest and staying well hydrated are a must!  If you have questions about the safety of specific activities ask your provider.    Exposure to Children with illness: Try to avoid obvious exposure; report any symptoms to us when noted,  If you have chicken pos, red measles or mumps, you should be immune to these diseases.   Please do not take any vaccines while pregnant unless you have checked with your OB provider.  Fetal Movement: After 28 weeks we recommend you do "kick counts" twice daily.  Lie or sit down in a calm quiet environment and count your baby movements "kicks".  You should feel your baby at least 10 times per hour.  If you have not felt 10 kicks within the first hour get up, walk around and have something sweet to eat or drink then repeat for an additional hour.  If count remains less than 10 per hour notify your provider.  Fumigating: Follow your pest control agent's  advice as to how long to stay out of your home.  Ventilate the area well before re-entering.  Hemorrhoids:   Most over-the-counter preparations can be used during pregnancy.  Check your medication to see what is safe to use.  It is important to use a stool softener or fiber in your diet and to drink lots of liquids.  If hemorrhoids seem to be getting worse please call the office.   Hot Tubs:  Hot tubs Jacuzzis and saunas are not recommended while pregnant.  These increase your internal body temperature and should be avoided.  Intercourse:  Sexual intercourse is safe during pregnancy as long as you are comfortable, unless otherwise advised by your provider.  Spotting may occur after intercourse; report any bright red bleeding that is heavier than spotting.  Labor:  If you know that you are in labor, please go to the hospital.  If you are unsure, please call the office and let us help you decide what to do.  Lifting, straining, etc:  If your job requires heavy lifting or straining please check with your provider for any limitations.  Generally, you should not lift items heavier than that you can lift simply with your hands and arms (no back muscles)  Painting:  Paint fumes do not harm your pregnancy, but may make you ill and should be avoided if possible.  Latex or water based paints have less odor than oils.  Use adequate ventilation while painting.  Permanents & Hair Color:  Chemicals in hair dyes are not recommended as they cause increase hair dryness which can increase hair loss during pregnancy.  " Highlighting" and permanents are allowed.  Dye may be absorbed differently and permanents may not hold as well during pregnancy.  Sunbathing:  Use a sunscreen, as skin burns easily during pregnancy.  Drink plenty of fluids; avoid over heating.  Tanning Beds:  Because their possible side effects are still unknown, tanning beds are not recommended.  Ultrasound Scans:  Routine ultrasounds are performed  at approximately 20 weeks.  You will be able to see your baby's general anatomy an if you would like to know the gender this can usually be determined as well.  If it is questionable when you conceived you may also receive an ultrasound early in your pregnancy for dating purposes.  Otherwise ultrasound exams are not routinely performed unless there is a medical necessity.  Although you can request a scan we ask that you pay for it when   conducted because insurance does not cover " patient request" scans.  Work: If your pregnancy proceeds without complications you may work until your due date, unless your physician or employer advises otherwise.  Round Ligament Pain/Pelvic Discomfort:  Sharp, shooting pains not associated with bleeding are fairly common, usually occurring in the second trimester of pregnancy.  They tend to be worse when standing up or when you remain standing for long periods of time.  These are the result of pressure of certain pelvic ligaments called "round ligaments".  Rest, Tylenol and heat seem to be the most effective relief.  As the womb and fetus grow, they rise out of the pelvis and the discomfort improves.  Please notify the office if your pain seems different than that described.  It may represent a more serious condition.  Common Medications Safe in Pregnancy  Acne:      Constipation:  Benzoyl Peroxide     Colace  Clindamycin      Dulcolax Suppository  Topica Erythromycin     Fibercon  Salicylic Acid      Metamucil         Miralax AVOID:        Senakot   Accutane    Cough:  Retin-A       Cough Drops  Tetracycline      Phenergan w/ Codeine if Rx  Minocycline      Robitussin (Plain & DM)  Antibiotics:     Crabs/Lice:  Ceclor       RID  Cephalosporins    AVOID:  E-Mycins      Kwell  Keflex  Macrobid/Macrodantin   Diarrhea:  Penicillin      Kao-Pectate  Zithromax      Imodium AD         PUSH FLUIDS AVOID:       Cipro     Fever:  Tetracycline      Tylenol (Regular  or Extra  Minocycline       Strength)  Levaquin      Extra Strength-Do not          Exceed 8 tabs/24 hrs Caffeine:        <200mg/day (equiv. To 1 cup of coffee or  approx. 3 12 oz sodas)         Gas: Cold/Hayfever:       Gas-X  Benadryl      Mylicon  Claritin       Phazyme  **Claritin-D        Chlor-Trimeton    Headaches:  Dimetapp      ASA-Free Excedrin  Drixoral-Non-Drowsy     Cold Compress  Mucinex (Guaifenasin)     Tylenol (Regular or Extra  Sudafed/Sudafed-12 Hour     Strength)  **Sudafed PE Pseudoephedrine   Tylenol Cold & Sinus     Vicks Vapor Rub  Zyrtec  **AVOID if Problems With Blood Pressure         Heartburn: Avoid lying down for at least 1 hour after meals  Aciphex      Maalox     Rash:  Milk of Magnesia     Benadryl    Mylanta       1% Hydrocortisone Cream  Pepcid  Pepcid Complete   Sleep Aids:  Prevacid      Ambien   Prilosec       Benadryl  Rolaids       Chamomile Tea  Tums (Limit 4/day)     Unisom           Tylenol PM         Warm milk-add vanilla or  Hemorrhoids:       Sugar for taste  Anusol/Anusol H.C.  (RX: Analapram 2.5%)  Sugar Substitutes:  Hydrocortisone OTC     Ok in moderation  Preparation H      Tucks        Vaseline lotion applied to tissue with wiping    Herpes:     Throat:  Acyclovir      Oragel  Famvir  Valtrex     Vaccines:         Flu Shot Leg Cramps:       *Gardasil  Benadryl      Hepatitis A         Hepatitis B Nasal Spray:       Pneumovax  Saline Nasal Spray     Polio Booster         Tetanus Nausea:       Tuberculosis test or PPD  Vitamin B6 25 mg TID   AVOID:    Dramamine      *Gardasil  Emetrol       Live Poliovirus  Ginger Root 250 mg QID    MMR (measles, mumps &  High Complex Carbs @ Bedtime    rebella)  Sea Bands-Accupressure    Varicella (Chickenpox)  Unisom 1/2 tab TID     *No known complications           If received before Pain:         Known pregnancy;   Darvocet       Resume series  after  Lortab        Delivery  Percocet    Yeast:   Tramadol      Femstat  Tylenol 3      Gyne-lotrimin  Ultram       Monistat  Vicodin           MISC:         All Sunscreens           Hair Coloring/highlights          Insect Repellant's          (Including DEET)         Mystic Tans  

## 2023-06-14 ENCOUNTER — Ambulatory Visit (INDEPENDENT_AMBULATORY_CARE_PROVIDER_SITE_OTHER): Payer: BC Managed Care – PPO

## 2023-06-14 ENCOUNTER — Other Ambulatory Visit (HOSPITAL_COMMUNITY)
Admission: RE | Admit: 2023-06-14 | Discharge: 2023-06-14 | Disposition: A | Discharge status: Home / Self Care | Payer: BC Managed Care – PPO | Source: Ambulatory Visit

## 2023-06-14 VITALS — BP 139/84 | HR 109 | Wt 250.0 lb

## 2023-06-14 DIAGNOSIS — E282 Polycystic ovarian syndrome: Secondary | ICD-10-CM

## 2023-06-14 DIAGNOSIS — Z3480 Encounter for supervision of other normal pregnancy, unspecified trimester: Secondary | ICD-10-CM

## 2023-06-14 DIAGNOSIS — Z3402 Encounter for supervision of normal first pregnancy, second trimester: Secondary | ICD-10-CM | POA: Diagnosis present

## 2023-06-14 DIAGNOSIS — O99211 Obesity complicating pregnancy, first trimester: Secondary | ICD-10-CM

## 2023-06-14 DIAGNOSIS — O10011 Pre-existing essential hypertension complicating pregnancy, first trimester: Secondary | ICD-10-CM

## 2023-06-14 DIAGNOSIS — O99341 Other mental disorders complicating pregnancy, first trimester: Secondary | ICD-10-CM | POA: Diagnosis not present

## 2023-06-14 DIAGNOSIS — O10919 Unspecified pre-existing hypertension complicating pregnancy, unspecified trimester: Secondary | ICD-10-CM | POA: Insufficient documentation

## 2023-06-14 DIAGNOSIS — Z3A13 13 weeks gestation of pregnancy: Secondary | ICD-10-CM

## 2023-06-14 DIAGNOSIS — F32A Depression, unspecified: Secondary | ICD-10-CM | POA: Diagnosis not present

## 2023-06-14 DIAGNOSIS — O99210 Obesity complicating pregnancy, unspecified trimester: Secondary | ICD-10-CM

## 2023-06-14 DIAGNOSIS — F419 Anxiety disorder, unspecified: Secondary | ICD-10-CM

## 2023-06-14 DIAGNOSIS — E663 Overweight: Secondary | ICD-10-CM

## 2023-06-14 NOTE — Progress Notes (Signed)
NEW OB HISTORY AND PHYSICAL  SUBJECTIVE:       Paige Williamson is a 27 y.o. G54P0010 female, Patient's last menstrual period was 03/15/2023., Estimated Date of Delivery: 12/20/23, [redacted]w[redacted]d, presents today for establishment of Prenatal Care. She reports occasional nausea, tender breasts, fatigue.    Social history Partner/Relationship: husband Greig Castilla Living situation: lives with husband Exercise: provided counseling on recommendations in pregnancy Substance use: none   Gynecologic History Pregnancy dating reviewed:  Patient's last menstrual period was 03/15/2023. Normal Contraception: none Last Pap: 2022, NILM.   Other Health History Chronic Hypertension - Taking 50 mg labetalol BID pre-pregnancy  Anxiety/Depression/OCD - Seeing psychiatrist regularly. - Currently on 10mg  escitalopram daily  Obesity in pregnancy - Pre-pregnancy BMI 47  Obstetric History OB History  Gravida Para Term Preterm AB Living  2       1    SAB IAB Ectopic Multiple Live Births  1            # Outcome Date GA Lbr Len/2nd Weight Sex Type Anes PTL Lv  2 Current           1 SAB 05/2022     SAB        Birth Comments: D&C done    Past Medical History:  Diagnosis Date   Anxiety    GERD (gastroesophageal reflux disease)    OCD (obsessive compulsive disorder)    PCOS (polycystic ovarian syndrome) 01/2023    Past Surgical History:  Procedure Laterality Date   DILATION AND EVACUATION N/A 05/30/2022   Procedure: SUCTION D&C;  Surgeon: Christeen Douglas, MD;  Location: [facility] ORS;  Service: Gynecology;  Laterality: N/A;   WISDOM TOOTH EXTRACTION     age 10; all four    Current Outpatient Medications on File Prior to Visit  Medication Sig Dispense Refill   acetaminophen (TYLENOL) 500 MG tablet Take 2 tablets (1,000 mg total) by mouth every 6 (six) hours as needed for mild pain or moderate pain. 30 tablet 0   ARIPiprazole (ABILIFY) 2 MG tablet Take 2 mg by mouth at bedtime. (Patient not taking: Reported on  05/07/2023)     Cholecalciferol (VITAMIN D-3 PO) Take 1,000 Units by mouth at bedtime.     escitalopram (LEXAPRO) 10 MG tablet Take 10 mg by mouth at bedtime.     labetalol (NORMODYNE) 100 MG tablet Take 100 mg by mouth once. Takes half pill in am and half at night.     ondansetron (ZOFRAN-ODT) 4 MG disintegrating tablet Take 1 tablet (4 mg total) by mouth every 8 (eight) hours as needed for nausea or vomiting. (Patient not taking: Reported on 06/04/2022) 20 tablet 0   Prenatal Vit-Fe Fumarate-FA (MULTIVITAMIN-PRENATAL) 27-0.8 MG TABS tablet Take 1 tablet by mouth daily at 12 noon.     progesterone (PROMETRIUM) 100 MG capsule Take 100 mg by mouth daily.     No current facility-administered medications on file prior to visit.    Allergies  Allergen Reactions   Shrimp Extract Swelling   Other Rash   Shellfish Allergy     Social History   Socioeconomic History   Marital status: Married    Spouse name: Greig Castilla   Number of children: 0   Years of education: 16   Highest education level: Not on file  Occupational History   Occupation: activities Press photographer at [employer]  Tobacco Use   Smoking status: Never   Smokeless tobacco: Never  Vaping Use   Vaping status: Never Used  Substance and  Sexual Activity   Alcohol use: Not Currently    Comment: socially   Drug use: No   Sexual activity: Yes    Partners: Male    Birth control/protection: None  Other Topics Concern   Not on file  Social History Narrative   Not on file   Social Determinants of Health   Financial Resource Strain: Low Risk  (05/07/2023)   Overall Financial Resource Strain (CARDIA)    Difficulty of Paying Living Expenses: Not hard at all  Food Insecurity: No Food Insecurity (05/07/2023)   Hunger Vital Sign    Worried About Running Out of Food in the Last Year: Never true    Ran Out of Food in the Last Year: Never true  Transportation Needs: No Transportation Needs (05/07/2023)   PRAPARE - Doctor, general practice (Medical): No    Lack of Transportation (Non-Medical): No  Physical Activity: Sufficiently Active (05/07/2023)   Exercise Vital Sign    Days of Exercise per Week: 4 days    Minutes of Exercise per Session: 40 min  Stress: No Stress Concern Present (05/07/2023)   [employer] of Occupational Health - Occupational Stress Questionnaire    Feeling of Stress : Not at all  Social Connections: Socially Integrated (05/07/2023)   Social Connection and Isolation Panel [NHANES]    Frequency of Communication with Friends and Family: More than three times a week    Frequency of Social Gatherings with Friends and Family: Twice a week    Attends Religious Services: More than 4 times per year    Active Member of [employer] or Organizations: Yes    Attends Engineer, structural: More than 4 times per year    Marital Status: Married  Catering manager Violence: Not At Risk (05/07/2023)   Humiliation, Afraid, Rape, and Kick questionnaire    Fear of Current or Ex-Partner: No    Emotionally Abused: No    Physically Abused: No    Sexually Abused: No    Family History  Problem Relation Age of Onset   Depression Mother    Hyperlipidemia Father    Gout Father    Healthy Paternal Grandmother    Cancer Paternal Grandfather        Liver   Healthy Half-Brother     The following portions of the patient's history were reviewed and updated as appropriate: allergies, current medications, past OB history, past medical history, past surgical history, past family history, past social history, and problem list.  Constitutional: Denied constitutional symptoms, night sweats, recent illness, fatigue, fever, insomnia and weight loss.  Eyes: Denied eye symptoms, eye pain, photophobia, vision change and visual disturbance.  Ears/Nose/Throat/Neck: Denied ear, nose, throat or neck symptoms, hearing loss, nasal discharge, sinus congestion and sore throat.  Cardiovascular: Denied cardiovascular symptoms,  arrhythmia, chest pain/pressure, edema, exercise intolerance, orthopnea and palpitations.  Respiratory: Denied pulmonary symptoms, asthma, pleuritic pain, productive sputum, cough, dyspnea and wheezing.  Gastrointestinal: Positive for occasional nausea  Genitourinary: Denied genitourinary symptoms including symptomatic vaginal discharge, pelvic relaxation issues, and urinary complaints.  Musculoskeletal: Denied musculoskeletal symptoms, stiffness, swelling, muscle weakness and myalgia.  Dermatologic: Denied dermatology symptoms, rash and scar.  Neurologic: Denied neurology symptoms, dizziness, headache, neck pain and syncope.  Psychiatric: Denied psychiatric symptoms, anxiety and depression.  Endocrine: Denied endocrine symptoms including hot flashes and night sweats.     OBJECTIVE: Initial Physical Exam (New OB)  GENERAL APPEARANCE: alert, well appearing HEAD: normocephalic, atraumatic MOUTH: mucous membranes moist, pharynx normal  without lesions THYROID: no thyromegaly or masses present BREASTS: not examined LUNGS: clear to auscultation, no wheezes, rales or rhonchi, symmetric air entry HEART: regular rate and rhythm, no murmurs ABDOMEN: soft, nontender, nondistended, no abnormal masses, no epigastric pain EXTREMITIES: no redness or tenderness in the calves or thighs SKIN: normal coloration and turgor, no rashes  PELVIC EXAM deferred  ASSESSMENT/PLAN  Normal pregnancy  Chronic Hypertension - Given blood pressures currently in the high 130s-140/80s, will increase labetalol to 100 mg BID. - Will start on daily LDASA - PIH baseline labs collected with NOB labs - MFM consult for anatomy ultrasound, q 4 wks growth [place] at 28 wks, , NSTs at 32 wks  BMI > 45 - Increase folic acid to 1 mg - Anesthesia consult ordered - Plan for early 1 hour GTT at 16-17 weeks  Anxiety/Depression/OCD - Will continue to monitor. Reviewed possible need to increase medication in third  trimester.   Routine prenatal care. We discussed an overview of prenatal care and when to call. Reviewed diet, exercise, and weight gain recommendations in pregnancy. Discussed benefits of breastfeeding and lactation resources at [facility]. I reviewed labs and answered all questions.  Anatomy ultrasound with MFM ordered for 18-20 weeks.  NOB labs: collected today with Maternity 21 genetic screening  See orders  Autumn Messing, [place] 06/14/23 2:35 PM

## 2023-06-21 ENCOUNTER — Telehealth: Payer: Self-pay

## 2023-06-21 ENCOUNTER — Ambulatory Visit (INDEPENDENT_AMBULATORY_CARE_PROVIDER_SITE_OTHER): Payer: BC Managed Care – PPO | Admitting: Certified Nurse Midwife

## 2023-06-21 ENCOUNTER — Encounter: Payer: Self-pay | Admitting: Certified Nurse Midwife

## 2023-06-21 VITALS — BP 133/73 | HR 102 | Wt 250.0 lb

## 2023-06-21 DIAGNOSIS — O0992 Supervision of high risk pregnancy, unspecified, second trimester: Secondary | ICD-10-CM

## 2023-06-21 DIAGNOSIS — R109 Unspecified abdominal pain: Secondary | ICD-10-CM

## 2023-06-21 DIAGNOSIS — Z3A14 14 weeks gestation of pregnancy: Secondary | ICD-10-CM

## 2023-06-21 DIAGNOSIS — O10912 Unspecified pre-existing hypertension complicating pregnancy, second trimester: Secondary | ICD-10-CM

## 2023-06-21 DIAGNOSIS — O10919 Unspecified pre-existing hypertension complicating pregnancy, unspecified trimester: Secondary | ICD-10-CM

## 2023-06-21 NOTE — Progress Notes (Signed)
    Return Prenatal Note   Subjective   27 y.o. G2P0010 at [redacted]w[redacted]d presents for this follow-up prenatal visit.  Patient reports feeling abdominal pressure to lower abdomen, worse with activity and end of day. Resolved yesterday with rest. Denies pelvic pain or pressure, denies vaginal bleeding, change to vaginal discharge, cramping or contractions. Patient reports: Movement: Absent Contractions: Not present  Objective   Flow sheet Vitals: Pulse Rate: (!) 102 BP: 133/73 Fetal Heart Rate (bpm): 155 Total weight gain: -3 lb (-1.361 kg)  General Appearance  No acute distress, well appearing, and well nourished Pulmonary   Normal work of breathing Neurologic   Alert and oriented to person, place, and time Psychiatric   Mood and affect within normal limits  Assessment/Plan   Plan  27 y.o. G2P0010 at [redacted]w[redacted]d presents for follow-up OB visit. Reviewed prenatal record including previous visit note. Reassured of normalcy of lower abdominal heaviness & pressure as uterus grows. Warning signs reviewed. FHT easily heard with doppler.   Repeat blood pressure normotensive & Betzy reports home blood pressures are usually 110-120/70s.  1. Encounter for supervision of high risk pregnancy in second trimester, antepartum  2. Chronic hypertension affecting pregnancy  3. [redacted] weeks gestation of pregnancy  4. Abdominal discomfort    No orders of the defined types were placed in this encounter.  Return in 3 weeks (on 07/12/2023) for ROB.   Future Appointments  Date Time Provider Department Center  07/13/2023  8:00 AM [facility] None  07/13/2023  8:55 AM Hartley Barefoot L, CNM [facility] None  07/19/2023 10:00 AM [facility] None  07/30/2023  8:00 AM [facility]    For next visit:  continue with routine prenatal care     Dominica Severin, CNM  08/15/244:33 PM

## 2023-07-13 ENCOUNTER — Ambulatory Visit (INDEPENDENT_AMBULATORY_CARE_PROVIDER_SITE_OTHER): Payer: BC Managed Care – PPO | Admitting: Certified Nurse Midwife

## 2023-07-13 ENCOUNTER — Other Ambulatory Visit: Payer: BC Managed Care – PPO

## 2023-07-13 VITALS — BP 129/84 | HR 104 | Wt 251.0 lb

## 2023-07-13 DIAGNOSIS — E282 Polycystic ovarian syndrome: Secondary | ICD-10-CM

## 2023-07-13 DIAGNOSIS — Z131 Encounter for screening for diabetes mellitus: Secondary | ICD-10-CM

## 2023-07-13 DIAGNOSIS — Z3A17 17 weeks gestation of pregnancy: Secondary | ICD-10-CM

## 2023-07-13 DIAGNOSIS — O10919 Unspecified pre-existing hypertension complicating pregnancy, unspecified trimester: Secondary | ICD-10-CM

## 2023-07-13 DIAGNOSIS — O99210 Obesity complicating pregnancy, unspecified trimester: Secondary | ICD-10-CM

## 2023-07-13 DIAGNOSIS — Z3480 Encounter for supervision of other normal pregnancy, unspecified trimester: Secondary | ICD-10-CM

## 2023-07-13 DIAGNOSIS — O10012 Pre-existing essential hypertension complicating pregnancy, second trimester: Secondary | ICD-10-CM

## 2023-07-13 NOTE — Progress Notes (Signed)
    Return Prenatal Note   Subjective   27 y.o. G2P0010 at [redacted]w[redacted]d presents for this follow-up prenatal visit.  Zilda feeling well, early 1h completed today. Anatomy ultrasound scheduled. Normotensive. Patient reports: Movement: Absent Contractions: Not present  Objective   Flow sheet Vitals: Pulse Rate: (!) 104 BP: 129/84 Fetal Heart Rate (bpm): 140 Total weight gain: -2 lb (-0.907 kg)  General Appearance  No acute distress, well appearing, and well nourished Pulmonary   Normal work of breathing Neurologic   Alert and oriented to person, place, and time Psychiatric   Mood and affect within normal limits  Assessment/Plan   Plan  27 y.o. G2P0010 at [redacted]w[redacted]d presents for follow-up OB visit. Reviewed prenatal record including previous visit note. 1. Supervision of other normal pregnancy, antepartum  2. Chronic hypertension affecting pregnancy  3. Screening for diabetes mellitus    No orders of the defined types were placed in this encounter.  Return in 4 weeks (on 08/10/2023) for ROB.   Future Appointments  Date Time Provider Department Center  07/19/2023 10:00 AM [facility] None  07/30/2023  8:00 AM [facility]  08/09/2023  8:55 AM Free, Lindalou Hose, CNM [facility] None    For next visit:  continue with routine prenatal care     Dominica Severin, CNM  07/12/2410:47 AM

## 2023-07-13 NOTE — Patient Instructions (Signed)

## 2023-07-14 LAB — GLUCOSE, 1 HOUR GESTATIONAL: Gestational Diabetes Screen: 80 mg/dL (ref 70–139)

## 2023-07-17 ENCOUNTER — Encounter: Payer: Self-pay | Admitting: Certified Nurse Midwife

## 2023-07-19 ENCOUNTER — Encounter
Admission: RE | Admit: 2023-07-19 | Discharge: 2023-07-19 | Disposition: A | Discharge status: Home / Self Care | Payer: BC Managed Care – PPO | Source: Ambulatory Visit | Attending: Anesthesiology | Admitting: Anesthesiology

## 2023-07-19 NOTE — Progress Notes (Signed)
[facility] Anesthesia Consultation  Paige Williamson ZOX:[id number] DOB: May 11, 1996 DOA: 07/19/2023 PCP: Lynnda Child, DO   Requesting physician:  Date of consultation: [id number] Reason for consultation: Obesity during pregnancy  CHIEF COMPLAINT:  Obesity during pregnancy  HISTORY OF PRESENT ILLNESS: Paige Williamson  is a 27 y.o. female with a known history of obesity    PAST MEDICAL HISTORY:   Past Medical History:  Diagnosis Date   Anxiety    GERD (gastroesophageal reflux disease)    OCD (obsessive compulsive disorder)    PCOS (polycystic ovarian syndrome) 01/2023    PAST SURGICAL HISTORY:  Past Surgical History:  Procedure Laterality Date   DILATION AND EVACUATION N/A 05/30/2022   Procedure: SUCTION D&C;  Surgeon: Christeen Douglas, MD;  Location: [facility] ORS;  Service: Gynecology;  Laterality: N/A;   WISDOM TOOTH EXTRACTION     age 42; all four    SOCIAL HISTORY:  Social History   Tobacco Use   Smoking status: Never   Smokeless tobacco: Never  Substance Use Topics   Alcohol use: Not Currently    Comment: socially    FAMILY HISTORY:  Family History  Problem Relation Age of Onset   Depression Mother    Hyperlipidemia Father    Gout Father    Healthy Paternal Grandmother    Cancer Paternal Grandfather        Liver   Healthy Half-Brother     DRUG ALLERGIES:  Allergies  Allergen Reactions   Shrimp Extract Swelling   Other Rash   Shellfish Allergy     REVIEW OF SYSTEMS:   RESPIRATORY: No cough, shortness of breath, wheezing.  CARDIOVASCULAR: No chest pain, orthopnea, edema.  HEMATOLOGY: No anemia, easy bruising or bleeding SKIN: No rash or lesion. NEUROLOGIC: No tingling, numbness, weakness.  PSYCHIATRY: No anxiety or depression.   MEDICATIONS AT HOME:  Prior to Admission medications   Medication Sig Start Date End Date Taking? Authorizing Provider  Cholecalciferol (VITAMIN D-3 PO) Take 1,000 Units by mouth at  bedtime.    [provider]  escitalopram (LEXAPRO) 10 MG tablet Take 10 mg by mouth at bedtime.    [provider]  labetalol (NORMODYNE) 100 MG tablet Take 100 mg by mouth 2 (two) times daily. Takes one  pill in am and half at night. 04/11/23 04/10/24  [provider]  Prenatal Vit-Fe Fumarate-FA (MULTIVITAMIN-PRENATAL) 27-0.8 MG TABS tablet Take 1 tablet by mouth daily at 12 noon.    [provider]      PHYSICAL EXAMINATION:   VITAL SIGNS: Last menstrual period 03/15/2023, unknown if currently breastfeeding.  GENERAL:  27 y.o.-year-old patient no acute distress.  HEENT: Head atraumatic, normocephalic. Oropharynx and nasopharynx clear. MP 1, TM distance >3 cm, normal mouth opening. LUNGS: No use of accessory muscles of respiration.   EXTREMITIES: No pedal edema, cyanosis, or clubbing.  NEUROLOGIC: normal gait PSYCHIATRIC: The patient is alert and oriented x 3.  SKIN: No obvious rash, lesion, or ulcer.    IMPRESSION AND PLAN:   Paige Williamson  is a 27 y.o. female presenting with obesity during pregnancy. BMI is currently approx. 40 at [redacted] weeks gestation.   We discussed analgesic options during labor including epidural analgesia. Discussed that in obesity there can be increased difficulty with epidural placement or even failure of successful epidural. We also discussed that even after successful epidural placement there is increased risk of catheter migration out of the epidural space that would require catheter replacement. Discussed use of epidural  vs spinal vs GA if cesarean delivery is required. Discussed increased risk of difficult intubation during pregnancy should an emergency cesarean delivery be required.   We discussed repeat evaluation at 35-36 weeks by anesthesia to determine whether there is a high risk of complications of anesthesia for which we would recommend transfer of OB care to a facility with a higher maternal level of care designation.

## 2023-07-26 ENCOUNTER — Other Ambulatory Visit: Payer: BC Managed Care – PPO

## 2023-07-26 ENCOUNTER — Ambulatory Visit: Payer: BC Managed Care – PPO

## 2023-07-30 ENCOUNTER — Ambulatory Visit: Payer: BC Managed Care – PPO | Attending: Maternal & Fetal Medicine

## 2023-07-30 ENCOUNTER — Ambulatory Visit: Payer: BC Managed Care – PPO

## 2023-07-30 ENCOUNTER — Other Ambulatory Visit: Payer: Self-pay

## 2023-07-30 VITALS — BP 122/79 | HR 97 | Temp 97.8°F | Ht 61.0 in | Wt 253.0 lb

## 2023-07-30 DIAGNOSIS — O10919 Unspecified pre-existing hypertension complicating pregnancy, unspecified trimester: Secondary | ICD-10-CM

## 2023-07-30 DIAGNOSIS — O35HXX0 Maternal care for other (suspected) fetal abnormality and damage, fetal lower extremities anomalies, not applicable or unspecified: Secondary | ICD-10-CM | POA: Insufficient documentation

## 2023-07-30 DIAGNOSIS — Z3402 Encounter for supervision of normal first pregnancy, second trimester: Secondary | ICD-10-CM | POA: Diagnosis present

## 2023-07-30 DIAGNOSIS — E669 Obesity, unspecified: Secondary | ICD-10-CM

## 2023-07-30 DIAGNOSIS — O10012 Pre-existing essential hypertension complicating pregnancy, second trimester: Secondary | ICD-10-CM | POA: Insufficient documentation

## 2023-07-30 DIAGNOSIS — Z3A19 19 weeks gestation of pregnancy: Secondary | ICD-10-CM | POA: Diagnosis not present

## 2023-07-30 DIAGNOSIS — O99212 Obesity complicating pregnancy, second trimester: Secondary | ICD-10-CM | POA: Insufficient documentation

## 2023-07-30 DIAGNOSIS — Z363 Encounter for antenatal screening for malformations: Secondary | ICD-10-CM | POA: Diagnosis not present

## 2023-07-30 DIAGNOSIS — Z3480 Encounter for supervision of other normal pregnancy, unspecified trimester: Secondary | ICD-10-CM

## 2023-08-09 ENCOUNTER — Ambulatory Visit: Payer: BC Managed Care – PPO

## 2023-08-09 VITALS — BP 116/71 | HR 97 | Wt 251.9 lb

## 2023-08-09 DIAGNOSIS — Z3A21 21 weeks gestation of pregnancy: Secondary | ICD-10-CM

## 2023-08-09 DIAGNOSIS — O99212 Obesity complicating pregnancy, second trimester: Secondary | ICD-10-CM

## 2023-08-09 DIAGNOSIS — O35HXX0 Maternal care for other (suspected) fetal abnormality and damage, fetal lower extremities anomalies, not applicable or unspecified: Secondary | ICD-10-CM

## 2023-08-09 DIAGNOSIS — Z3480 Encounter for supervision of other normal pregnancy, unspecified trimester: Secondary | ICD-10-CM

## 2023-08-09 DIAGNOSIS — O10012 Pre-existing essential hypertension complicating pregnancy, second trimester: Secondary | ICD-10-CM

## 2023-08-09 DIAGNOSIS — O10919 Unspecified pre-existing hypertension complicating pregnancy, unspecified trimester: Secondary | ICD-10-CM

## 2023-08-09 LAB — POCT URINALYSIS DIPSTICK
Bilirubin, UA: NEGATIVE
Blood, UA: NEGATIVE
Glucose, UA: NEGATIVE
Ketones, UA: NEGATIVE
Leukocytes, UA: NEGATIVE
Nitrite, UA: NEGATIVE
Protein, UA: NEGATIVE
Spec Grav, UA: 1.02 (ref 1.010–1.025)
Urobilinogen, UA: 0.2 U/dL
pH, UA: 7 (ref 5.0–8.0)

## 2023-08-09 NOTE — Assessment & Plan Note (Signed)
-   Continues to take labetalol. Normotensive today.

## 2023-08-09 NOTE — Assessment & Plan Note (Signed)
-   Reviewed findings of club foot on ultrasound. She is feeling ok about the diagnosis since she has a friend whose child also had club feet and was able to talk with her about the process for treatment.

## 2023-08-09 NOTE — Assessment & Plan Note (Addendum)
-   Has plans to take CBE in near future. - Reviewed timing of vaccinations in pregnancy including Tdap and RSV. Plans to get flu vaccine this Saturday. - Reviewed red flag warning signs anticipatory guidance for upcoming prenatal care.

## 2023-08-09 NOTE — Progress Notes (Signed)
    Return Prenatal Note   Assessment/Plan   Plan  27 y.o. G2P0010 at [redacted]w[redacted]d presents for follow-up OB visit. Reviewed prenatal record including previous visit note.  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 She is feeling ok about the diagnosis since she has a friend whose child also had club feet and was able to talk with her about the process for treatment.   Morbid obesity with BMI of 45.0-49.9, adult [facility]) - Reviewed plan for normal antenatal surveillance in pregnancy. Has follow up growth [place] scheduled for 10/21.  - MFM mentioned TOC at her last [place]. Reviewed that she can stay with our practice and deliver at [facility] as long as BMI is below 60.  Supervision of other normal pregnancy, antepartum - Has plans to take CBE in near future. - Reviewed timing of vaccinations in pregnancy including Tdap and RSV. Plans to get flu vaccine this Saturday. - Reviewed red flag warning signs anticipatory guidance for upcoming prenatal care.   Chronic hypertension affecting pregnancy - Continues to take labetalol. Normotensive today.   Orders Placed This Encounter  Procedures   POCT Urinalysis Dipstick   Return in about 4 weeks (around 09/06/2023) for ROB.   Future Appointments  Date Time Provider Department Center  08/27/2023  4:00 PM [facility]  09/05/2023  8:55 AM Doreene Burke, CNM [facility] None    For next visit:  continue with routine prenatal care     Subjective   27 y.o. G2P0010 at [redacted]w[redacted]d presents for this follow-up prenatal visit.  Patient has no concerns. Questions about vaccinations. Patient reports: Movement: Present Contractions: Not present  Objective   Flow sheet Vitals: Pulse Rate: 97 BP: 116/71 Fundal Height: 21 cm Fetal Heart Rate (bpm): 140 Total weight gain: -1 lb 1.6 oz (-0.499 kg)  General Appearance  No acute distress, well appearing, and well  nourished Pulmonary   Normal work of breathing Neurologic   Alert and oriented to person, place, and time Psychiatric   Mood and affect within normal limits  Lindalou Hose Berry Gallacher, CNM  08/08/2409:20 AM

## 2023-08-09 NOTE — Assessment & Plan Note (Signed)
-   Reviewed plan for normal antenatal surveillance in pregnancy. Has follow up growth [place] scheduled for 10/21.  - MFM mentioned TOC at her last [place]. Reviewed that she can stay with our practice and deliver at [facility] as long as BMI is below 60.

## 2023-08-13 ENCOUNTER — Other Ambulatory Visit: Payer: Self-pay | Admitting: Maternal & Fetal Medicine

## 2023-08-13 DIAGNOSIS — O10012 Pre-existing essential hypertension complicating pregnancy, second trimester: Secondary | ICD-10-CM

## 2023-08-27 ENCOUNTER — Other Ambulatory Visit: Payer: Self-pay

## 2023-08-27 ENCOUNTER — Ambulatory Visit: Payer: BC Managed Care – PPO

## 2023-08-27 ENCOUNTER — Ambulatory Visit: Payer: BC Managed Care – PPO | Attending: Maternal & Fetal Medicine

## 2023-08-27 ENCOUNTER — Other Ambulatory Visit: Payer: Self-pay | Admitting: Maternal & Fetal Medicine

## 2023-08-27 VITALS — BP 146/78 | HR 106 | Temp 98.1°F | Resp 20 | Ht 61.0 in | Wt 253.0 lb

## 2023-08-27 DIAGNOSIS — Z3A23 23 weeks gestation of pregnancy: Secondary | ICD-10-CM | POA: Diagnosis not present

## 2023-08-27 DIAGNOSIS — E669 Obesity, unspecified: Secondary | ICD-10-CM | POA: Diagnosis not present

## 2023-08-27 DIAGNOSIS — O99212 Obesity complicating pregnancy, second trimester: Secondary | ICD-10-CM | POA: Insufficient documentation

## 2023-08-27 DIAGNOSIS — O10919 Unspecified pre-existing hypertension complicating pregnancy, unspecified trimester: Secondary | ICD-10-CM

## 2023-08-27 DIAGNOSIS — O10012 Pre-existing essential hypertension complicating pregnancy, second trimester: Secondary | ICD-10-CM | POA: Diagnosis not present

## 2023-08-27 DIAGNOSIS — Q6689 Other  specified congenital deformities of feet: Secondary | ICD-10-CM

## 2023-08-27 DIAGNOSIS — O35HXX0 Maternal care for other (suspected) fetal abnormality and damage, fetal lower extremities anomalies, not applicable or unspecified: Secondary | ICD-10-CM | POA: Diagnosis not present

## 2023-08-27 DIAGNOSIS — Z3480 Encounter for supervision of other normal pregnancy, unspecified trimester: Secondary | ICD-10-CM

## 2023-09-05 ENCOUNTER — Ambulatory Visit (INDEPENDENT_AMBULATORY_CARE_PROVIDER_SITE_OTHER): Payer: BC Managed Care – PPO | Admitting: Certified Nurse Midwife

## 2023-09-05 ENCOUNTER — Encounter: Payer: Self-pay | Admitting: Certified Nurse Midwife

## 2023-09-05 VITALS — BP 120/80 | HR 97 | Wt 248.8 lb

## 2023-09-05 DIAGNOSIS — O10919 Unspecified pre-existing hypertension complicating pregnancy, unspecified trimester: Secondary | ICD-10-CM

## 2023-09-05 DIAGNOSIS — E6689 Other obesity not elsewhere classified: Secondary | ICD-10-CM

## 2023-09-05 DIAGNOSIS — Z3A24 24 weeks gestation of pregnancy: Secondary | ICD-10-CM

## 2023-09-05 DIAGNOSIS — O99210 Obesity complicating pregnancy, unspecified trimester: Secondary | ICD-10-CM

## 2023-09-05 NOTE — Patient Instructions (Signed)
Postprandial Glucose Test Why am I having this test? You may have a postprandial glucose test (PPG test) to: Screen for diabetes. More tests may be needed to confirm a diagnosis of diabetes. Check how well your diabetes management plan is working, if you are already diagnosed with diabetes. If you have diabetes, you may need to have PPG tests often, especially if you: Take more than one medicine and are at risk for high or low blood sugar (glucose). Have a history of high blood glucose after meals. Are a woman who is pregnant and has diabetes or a temporary form of diabetes that develops during pregnancy (gestational diabetes). Are on a new or changing insulin regimen. What is being tested? Your blood glucose level is tested 2 hours after you eat a meal. This provides information about how well your body responds to glucose (carbohydrates) after you eat a meal. What kind of sample is taken?  A blood sample is required for this test. It is usually collected by inserting a needle into a blood vessel. How do I prepare for this test? You will need to eat a meal that includes at least 75 grams of carbohydrates. The meal should include foods that contain carbohydrates, such as bread, pasta, potatoes, fruit, or milk. Two hours after eating this meal, a blood sample is taken. Do not do any of the following until after your blood sample is taken: Eat anything else. Eating anything during the testing period can affect test results. You may drink water during the testing period. Drink alcohol. Smoke or use nicotine containing products. Exercise. Take any medicines that will affect your blood glucose. Tell a health care provider about: Any allergies you have. All medicines you are taking, including vitamins, herbs, eye drops, creams, and over-the-counter medicines. Any blood disorders you have. Any surgeries you have had. Any medical conditions you have. Whether you are pregnant or may be  pregnant. Whether you are unable to finish your meal or drink, or you vomit. How are the results reported? Results will be reported as a value that shows how much glucose is in your blood. This may be reported as milligrams per deciliter (mg/dL) or millimoles per liter (mmol/L). Your health care provider will compare your results to normal ranges that were established after testing a large group of people (reference ranges). Reference ranges may vary among labs and hospitals. For this test, a common reference range is less than 140 mg/dL (7.8 mmol/L). If you have diabetes and you are being tested to see how well your diabetes management plan is working, a common reference range is less than 180 mg/dL (16.1 mmol/L). What do the results mean? If you have not yet been diagnosed with diabetes, results within the reference range may mean that you do not have diabetes. Results higher than your reference range may mean that you have: Endocrine disorders such as: Diabetes. Gestational diabetes, if applicable. Hyperthyroidism. Cushing syndrome. Other diseases or disorders such as: Malnutrition. Acute stress response. Tumors. Kidney failure. Liver disease. If you are already diagnosed with diabetes: Results within your reference range mean that your diabetes management plan works well and keeps your blood glucose in the normal range after you eat a meal. Results higher than your reference range may mean that your diabetes management plan does not work well. You may need to work with your health care provider to adjust your plan. Talk with your health care provider about what your results mean. Questions to ask your health care provider  Ask your health care provider, or the department that is doing the test: When will my results be ready? How will I get my results? What are my treatment options? What other tests do I need? What are my next steps? Summary The PPG test may be used to screen for  diabetes. If you are already diagnosed with diabetes, your health care provider may recommend that you have this test to check how well your diabetes management plan is working. This test is done to see how well your body responds to glucose (carbohydrates) after you eat a meal. Talk with your health care provider about what your results mean. This information is not intended to replace advice given to you by your health care provider. Make sure you discuss any questions you have with your health care provider. Document Revised: 05/30/2022 Document Reviewed: 12/07/2021 Elsevier Patient Education  2024 [employer].

## 2023-09-05 NOTE — Progress Notes (Signed)
ROB , feeling movement. Discussed glucose screen next visit. Handout given with instructions on eating prior to visit. Discussed Tdap next visit as well. She verbalizes and agrees to plan. She has MFM u/s scheduled for growth.   Follow up 3 weeks.   Doreene Burke, CNM

## 2023-09-24 ENCOUNTER — Other Ambulatory Visit: Payer: Self-pay

## 2023-09-24 ENCOUNTER — Ambulatory Visit: Payer: BC Managed Care – PPO

## 2023-09-24 ENCOUNTER — Ambulatory Visit: Payer: BC Managed Care – PPO | Attending: Maternal & Fetal Medicine

## 2023-09-24 DIAGNOSIS — Z79899 Other long term (current) drug therapy: Secondary | ICD-10-CM | POA: Diagnosis not present

## 2023-09-24 DIAGNOSIS — O35HXX0 Maternal care for other (suspected) fetal abnormality and damage, fetal lower extremities anomalies, not applicable or unspecified: Secondary | ICD-10-CM | POA: Diagnosis not present

## 2023-09-24 DIAGNOSIS — O358XX0 Maternal care for other (suspected) fetal abnormality and damage, not applicable or unspecified: Secondary | ICD-10-CM | POA: Diagnosis not present

## 2023-09-24 DIAGNOSIS — O10012 Pre-existing essential hypertension complicating pregnancy, second trimester: Secondary | ICD-10-CM | POA: Insufficient documentation

## 2023-09-24 DIAGNOSIS — O99212 Obesity complicating pregnancy, second trimester: Secondary | ICD-10-CM | POA: Insufficient documentation

## 2023-09-24 DIAGNOSIS — Q6689 Other  specified congenital deformities of feet: Secondary | ICD-10-CM | POA: Diagnosis present

## 2023-09-24 DIAGNOSIS — Z3A27 27 weeks gestation of pregnancy: Secondary | ICD-10-CM | POA: Diagnosis not present

## 2023-09-24 DIAGNOSIS — O10919 Unspecified pre-existing hypertension complicating pregnancy, unspecified trimester: Secondary | ICD-10-CM

## 2023-09-24 DIAGNOSIS — E669 Obesity, unspecified: Secondary | ICD-10-CM

## 2023-09-24 DIAGNOSIS — Z362 Encounter for other antenatal screening follow-up: Secondary | ICD-10-CM | POA: Diagnosis not present

## 2023-09-26 ENCOUNTER — Other Ambulatory Visit: Payer: BC Managed Care – PPO

## 2023-09-26 ENCOUNTER — Other Ambulatory Visit: Payer: Self-pay

## 2023-09-26 ENCOUNTER — Other Ambulatory Visit: Payer: Self-pay | Admitting: Obstetrics and Gynecology

## 2023-09-26 ENCOUNTER — Ambulatory Visit (INDEPENDENT_AMBULATORY_CARE_PROVIDER_SITE_OTHER): Payer: BC Managed Care – PPO

## 2023-09-26 VITALS — BP 116/79 | HR 93 | Wt 246.0 lb

## 2023-09-26 DIAGNOSIS — O10919 Unspecified pre-existing hypertension complicating pregnancy, unspecified trimester: Secondary | ICD-10-CM

## 2023-09-26 DIAGNOSIS — Z23 Encounter for immunization: Secondary | ICD-10-CM | POA: Diagnosis not present

## 2023-09-26 DIAGNOSIS — O35HXX0 Maternal care for other (suspected) fetal abnormality and damage, fetal lower extremities anomalies, not applicable or unspecified: Secondary | ICD-10-CM

## 2023-09-26 DIAGNOSIS — Z131 Encounter for screening for diabetes mellitus: Secondary | ICD-10-CM

## 2023-09-26 DIAGNOSIS — O10012 Pre-existing essential hypertension complicating pregnancy, second trimester: Secondary | ICD-10-CM

## 2023-09-26 DIAGNOSIS — Z3480 Encounter for supervision of other normal pregnancy, unspecified trimester: Secondary | ICD-10-CM

## 2023-09-26 DIAGNOSIS — Z3A28 28 weeks gestation of pregnancy: Secondary | ICD-10-CM

## 2023-09-26 DIAGNOSIS — Z3A27 27 weeks gestation of pregnancy: Secondary | ICD-10-CM

## 2023-09-26 DIAGNOSIS — F32A Depression, unspecified: Secondary | ICD-10-CM

## 2023-09-26 NOTE — Assessment & Plan Note (Signed)
-   Remains normotensive on current labetalol dose.  - Growth [place] on 11/18, EFW 76%, MVP 6.16, per MFM needs weekly BPPS from her next [place] with an NST on the week of 12/25.

## 2023-09-26 NOTE — Progress Notes (Signed)
    Return Prenatal Note   Assessment/Plan   Plan  27 y.o. G2P0010 at [redacted]w[redacted]d presents for follow-up OB visit. Reviewed prenatal record including previous visit note.  Chronic hypertension affecting pregnancy - Remains normotensive on current labetalol dose.  - Growth [place] on 11/18, EFW 76%, MVP 6.16, per MFM needs weekly BPPS from her next [place] with an NST on the week of 12/25.   Supervision of other normal pregnancy, antepartum - Gestational diabetes screening, third trimester labs, Tdap and BTFC completed today. - Taking last CBE today, hoping to wait as long as possible for epidural. Aware of other pain management options.  - Reviewed kick counts and preterm labor warning signs. Instructed to call office or come to hospital with persistent headache, vision changes, regular contractions, leaking of fluid, decreased fetal movement or vaginal bleeding.   Orders Placed This Encounter  Procedures   Tdap vaccine greater than or equal to 7yo IM   Return in about 2 weeks (around 10/10/2023) for ROB.   Future Appointments  Date Time Provider Department Center  10/24/2023 10:00 AM [facility]  11/05/2023  3:00 PM [facility]  11/12/2023 11:00 AM [facility]  11/19/2023  3:00 PM [facility]    For next visit:  continue with routine prenatal care     Subjective   27 y.o. G2P0010 at [redacted]w[redacted]d presents for this follow-up prenatal visit.  Patient has no concerns.  Patient reports: Movement: Present Contractions: Not present  Objective   Flow sheet Vitals: Pulse Rate: 93 BP: 116/79 Fundal Height: 28 cm Fetal Heart Rate (bpm): 135 Total weight gain: -7 lb (-3.175 kg)  General Appearance  No acute distress, well appearing, and well nourished Pulmonary   Normal work of breathing Neurologic   Alert and oriented to person, place, and time Psychiatric   Mood and affect within normal limits  Lindalou Hose [place],  CNM  11/20/248:51 AM

## 2023-09-26 NOTE — Assessment & Plan Note (Addendum)
-   Gestational diabetes screening, third trimester labs, Tdap and BTFC completed today. - Taking last CBE today, hoping to wait as long as possible for epidural. Aware of other pain management options.  - Reviewed kick counts and preterm labor warning signs. Instructed to call office or come to hospital with persistent headache, vision changes, regular contractions, leaking of fluid, decreased fetal movement or vaginal bleeding.

## 2023-09-27 LAB — 28 WEEK RH+PANEL
Basophils Absolute: 0 10*3/uL (ref 0.0–0.2)
Basos: 0 %
EOS (ABSOLUTE): 0.1 10*3/uL (ref 0.0–0.4)
Eos: 1 %
Gestational Diabetes Screen: 156 mg/dL — ABNORMAL HIGH (ref 70–139)
HIV Screen 4th Generation wRfx: NONREACTIVE
Hematocrit: 35.7 % (ref 34.0–46.6)
Hemoglobin: 11.8 g/dL (ref 11.1–15.9)
Immature Grans (Abs): 0.1 10*3/uL (ref 0.0–0.1)
Immature Granulocytes: 1 %
Lymphocytes Absolute: 1.5 10*3/uL (ref 0.7–3.1)
Lymphs: 13 %
MCH: 28.9 pg (ref 26.6–33.0)
MCHC: 33.1 g/dL (ref 31.5–35.7)
MCV: 87 fL (ref 79–97)
Monocytes Absolute: 0.4 10*3/uL (ref 0.1–0.9)
Monocytes: 4 %
Neutrophils Absolute: 9.6 10*3/uL — ABNORMAL HIGH (ref 1.4–7.0)
Neutrophils: 81 %
Platelets: 294 10*3/uL (ref 150–450)
RBC: 4.09 x10E6/uL (ref 3.77–5.28)
RDW: 13.1 % (ref 11.7–15.4)
RPR Ser Ql: NONREACTIVE
WBC: 11.7 10*3/uL — ABNORMAL HIGH (ref 3.4–10.8)

## 2023-09-28 ENCOUNTER — Other Ambulatory Visit: Payer: Self-pay

## 2023-09-28 DIAGNOSIS — R7309 Other abnormal glucose: Secondary | ICD-10-CM

## 2023-10-01 ENCOUNTER — Other Ambulatory Visit: Payer: Self-pay

## 2023-10-01 ENCOUNTER — Other Ambulatory Visit: Payer: BC Managed Care – PPO

## 2023-10-01 DIAGNOSIS — Z131 Encounter for screening for diabetes mellitus: Secondary | ICD-10-CM

## 2023-10-01 DIAGNOSIS — F419 Anxiety disorder, unspecified: Secondary | ICD-10-CM

## 2023-10-01 DIAGNOSIS — R7309 Other abnormal glucose: Secondary | ICD-10-CM

## 2023-10-01 MED ORDER — ESCITALOPRAM OXALATE 20 MG PO TABS: 20.0000 mg | ORAL_TABLET | Freq: Every day | ORAL | 6 refills | Status: AC

## 2023-10-01 NOTE — Telephone Encounter (Signed)
Over the last 2 weeks, how often have you been bothered by the following problems?   Feeling nervous, anxious, or on edge Several days  Not being able to stop or control worrying Not at all  Worrying too much about different things Several days  Trouble relaxing Several days  Being so restless that it is hard to sit still Not at all  Becoming easily annoyed or irritable Several days  Feeling afraid as if something awful might happen Several days  If you checked off any problems on this questionnaire, how difficult have these problems made it for you to do your work, take care of things at home, or get along with other people? Somewhat difficult  Score of 5

## 2023-10-01 NOTE — Progress Notes (Signed)
Received MyChart message from patient expressing increased anxiety as she enters third trimester. We had previously discussed increasing her SSRI in the third trimester which she would like to do now. Escitalopram increased from 10 mg to 20 mg daily.

## 2023-10-02 LAB — GESTATIONAL GLUCOSE TOLERANCE
Glucose, Fasting: 88 mg/dL (ref 70–94)
Glucose, GTT - 1 Hour: 159 mg/dL (ref 70–179)
Glucose, GTT - 2 Hour: 122 mg/dL (ref 70–154)
Glucose, GTT - 3 Hour: 53 mg/dL — ABNORMAL LOW (ref 70–139)

## 2023-10-10 ENCOUNTER — Ambulatory Visit (INDEPENDENT_AMBULATORY_CARE_PROVIDER_SITE_OTHER): Payer: BC Managed Care – PPO | Admitting: Certified Nurse Midwife

## 2023-10-10 ENCOUNTER — Encounter: Payer: Self-pay | Admitting: Certified Nurse Midwife

## 2023-10-10 VITALS — BP 126/80 | HR 93 | Wt 255.0 lb

## 2023-10-10 DIAGNOSIS — Z3483 Encounter for supervision of other normal pregnancy, third trimester: Secondary | ICD-10-CM

## 2023-10-10 DIAGNOSIS — O10919 Unspecified pre-existing hypertension complicating pregnancy, unspecified trimester: Secondary | ICD-10-CM

## 2023-10-10 DIAGNOSIS — Z3A29 29 weeks gestation of pregnancy: Secondary | ICD-10-CM

## 2023-10-10 DIAGNOSIS — F32A Depression, unspecified: Secondary | ICD-10-CM

## 2023-10-10 DIAGNOSIS — Z3480 Encounter for supervision of other normal pregnancy, unspecified trimester: Secondary | ICD-10-CM

## 2023-10-10 LAB — POCT URINALYSIS DIPSTICK OB
Bilirubin, UA: NEGATIVE
Blood, UA: NEGATIVE
Glucose, UA: NEGATIVE
Ketones, UA: NEGATIVE
Nitrite, UA: NEGATIVE
Urobilinogen, UA: 0.2 U/dL
pH, UA: 7.5 (ref 5.0–8.0)

## 2023-10-10 NOTE — Patient Instructions (Signed)

## 2023-10-10 NOTE — Assessment & Plan Note (Signed)
Reviewed kick counts and preterm labor warning signs. Instructed to call office or come to hospital with persistent headache, vision changes, regular contractions, leaking of fluid, decreased fetal movement or vaginal bleeding. 

## 2023-10-10 NOTE — Assessment & Plan Note (Signed)
Escitalopram increased to 20mg  with improvement of mood, continue at current dose.

## 2023-10-10 NOTE — Assessment & Plan Note (Signed)
Continue labetalol 100mg  po bid, timing of delivery in 39th week with well controlled blood pressure reviewed. MFM following with BPPs & growth, next 12/18.

## 2023-10-10 NOTE — Progress Notes (Signed)
    Return Prenatal Note   Subjective   27 y.o. G2P0010 at [redacted]w[redacted]d presents for this follow-up prenatal visit.  Patient denies concerns. Patient reports: Movement: Present Contractions: Not present  Objective   Flow sheet Vitals: Pulse Rate: 93 BP: 126/80 Fundal Height: 30 cm Fetal Heart Rate (bpm): 130 Total weight gain: 2 lb (0.907 kg)  General Appearance  No acute distress, well appearing, and well nourished Pulmonary   Normal work of breathing Neurologic   Alert and oriented to person, place, and time Psychiatric   Mood and affect within normal limits  Assessment/Plan   Plan  27 y.o. G2P0010 at [redacted]w[redacted]d presents for follow-up OB visit. Reviewed prenatal record including previous visit note.  Chronic hypertension affecting pregnancy Continue labetalol 100mg  po bid, timing of delivery in 39th week with well controlled blood pressure reviewed. MFM following with BPPs & growth, next 12/18.  Anxiety and depression Escitalopram increased to 20mg  with improvement of mood, continue at current dose.  Supervision of other normal pregnancy, antepartum Reviewed kick counts and preterm labor warning signs. Instructed to call office or come to hospital with persistent headache, vision changes, regular contractions, leaking of fluid, decreased fetal movement or vaginal bleeding.       Orders Placed This Encounter  Procedures   POC Urinalysis Dipstick OB   Return in 2 weeks (on 10/24/2023) for ROB.   Future Appointments  Date Time Provider Department Center  10/24/2023  8:15 AM Mirna Mires, CNM [facility] None  10/24/2023 10:00 AM [facility]  10/29/2023  8:15 AM [facility] None  11/05/2023  3:00 PM [facility]  11/08/2023  8:15 AM Glenetta Borg, CNM [facility] None  11/12/2023 11:00 AM [facility]  11/19/2023  3:00 PM [facility]    For next visit:  continue with routine  prenatal care     Dominica Severin, CNM  12/04/249:33 AM

## 2023-10-24 ENCOUNTER — Ambulatory Visit (INDEPENDENT_AMBULATORY_CARE_PROVIDER_SITE_OTHER): Payer: BC Managed Care – PPO | Admitting: Obstetrics

## 2023-10-24 ENCOUNTER — Ambulatory Visit: Payer: BC Managed Care – PPO | Attending: Maternal & Fetal Medicine

## 2023-10-24 DIAGNOSIS — O35HXX0 Maternal care for other (suspected) fetal abnormality and damage, fetal lower extremities anomalies, not applicable or unspecified: Secondary | ICD-10-CM | POA: Diagnosis not present

## 2023-10-24 DIAGNOSIS — Z3A31 31 weeks gestation of pregnancy: Secondary | ICD-10-CM | POA: Insufficient documentation

## 2023-10-24 DIAGNOSIS — O358XX0 Maternal care for other (suspected) fetal abnormality and damage, not applicable or unspecified: Secondary | ICD-10-CM | POA: Insufficient documentation

## 2023-10-24 DIAGNOSIS — Z2911 Encounter for prophylactic immunotherapy for respiratory syncytial virus (RSV): Secondary | ICD-10-CM

## 2023-10-24 DIAGNOSIS — Q6689 Other  specified congenital deformities of feet: Secondary | ICD-10-CM | POA: Diagnosis not present

## 2023-10-24 DIAGNOSIS — O10013 Pre-existing essential hypertension complicating pregnancy, third trimester: Secondary | ICD-10-CM

## 2023-10-24 DIAGNOSIS — O99213 Obesity complicating pregnancy, third trimester: Secondary | ICD-10-CM | POA: Diagnosis not present

## 2023-10-24 DIAGNOSIS — Z23 Encounter for immunization: Secondary | ICD-10-CM | POA: Diagnosis not present

## 2023-10-24 DIAGNOSIS — E669 Obesity, unspecified: Secondary | ICD-10-CM | POA: Diagnosis not present

## 2023-10-24 DIAGNOSIS — O10913 Unspecified pre-existing hypertension complicating pregnancy, third trimester: Secondary | ICD-10-CM | POA: Insufficient documentation

## 2023-10-24 DIAGNOSIS — Z3480 Encounter for supervision of other normal pregnancy, unspecified trimester: Secondary | ICD-10-CM

## 2023-10-24 DIAGNOSIS — O10919 Unspecified pre-existing hypertension complicating pregnancy, unspecified trimester: Secondary | ICD-10-CM

## 2023-10-24 NOTE — Progress Notes (Deleted)
t

## 2023-10-24 NOTE — Progress Notes (Signed)
Routine Prenatal Care Visit  Subjective  Paige Williamson is a 27 y.o. G2P0010 at [redacted]w[redacted]d being seen today for ongoing prenatal care.  She is currently monitored for the following issues for this high-risk pregnancy and has Anxiety and depression; Episode of heavy vaginal bleeding; Encounter for supervision of normal pregnancy; Grief; Supervision of other normal pregnancy, antepartum; PCOS (polycystic ovarian syndrome); Chronic hypertension affecting pregnancy; Morbid obesity with BMI of 45.0-49.9, adult (HCC); and Club foot of fetus affecting antepartum care of mother on their problem list.  ----------------------------------------------------------------------------------- Patient reports no complaints.  She is naming her baby Waylan Rocher. Doing very well moodwise. Having closer fetal surveillance with MFM; weekly BPPs, growth scans. Contractions: Not present.  .  Movement: Present. Leaking Fluid denies.  ----------------------------------------------------------------------------------- The following portions of the patient's history were reviewed and updated as appropriate: allergies, current medications, past family history, past medical history, past social history, past surgical history and problem list. Problem list updated.  Objective  Last menstrual period 03/15/2023, unknown if currently breastfeeding. Pregravid weight 253 lb (114.8 kg) Total Weight Gain 2 lb (0.907 kg) Urinalysis: Urine Protein    Urine Glucose    Fetal Status: Fetal Heart Rate (bpm): 131 Fundal Height: 35 cm Movement: Present     General:  Alert, oriented and cooperative. Patient is in no acute distress.  Skin: Skin is warm and dry. No rash noted.   Cardiovascular: Normal heart rate noted  Respiratory: Normal respiratory effort, no problems with respiration noted  Abdomen: Soft, gravid, appropriate for gestational age. Pain/Pressure: Absent     Pelvic:  Cervical exam deferred        Extremities: Normal range of motion.   Edema: Trace  Mental Status: Normal mood and affect. Normal behavior. Normal judgment and thought content.   Assessment   27 y.o. G2P0010 at [redacted]w[redacted]d by  12/20/2023, by Last Menstrual Period presenting for routine prenatal visit High risk pregnancy: HTN, high BMI, hx of depression/anxiety on Lexapro. Doing well! Plan   second Problems (from 05/07/23 to present)     Problem Noted Diagnosed Resolved   Club foot of fetus affecting antepartum care of mother 07/30/2023 by Burney Gauze, CNM  No   Overview Signed 07/30/2023  2:35 PM by Burney Gauze, CNM  Noted on MFM anatomy ultrasound, follow up with pediatric orthopedist after delivery      Chronic hypertension affecting pregnancy 06/14/2023 by Burney Gauze, CNM  No   Overview Addendum 09/26/2023  8:40 AM by Burney Gauze, CNM  Increased to 100 mg labetalol BID at NOB -Growth [place] q4 wks beginning at 28 wks - Weekly BPPs starting 12/18, NST week of 12/25 - Baseline PIH labs with NOB  11/18: EFW 76%, MVP 6.16      Supervision of other normal pregnancy, antepartum 05/07/2023 by Loran Senters, CMA  No   Overview Addendum 10/10/2023  8:51 AM by Dominica Severin, CNM   Clinical Staff Provider  Office Location   Ob/Gyn Dating  L/7w ultrasound  Language  English Anatomy US  Transfer from [facility]  Flu Vaccine  08/26/23 Genetic Screen  NIPS: MFM  TDaP vaccine  09/26/23 Hgb A1C or  GTT Early : Third trimester :   Covid No second booster   LAB RESULTS   Rhogam  O/Positive/-- (08/08 1540)  Blood Type O/Positive/-- (08/08 1540)   Feeding Plan breast Antibody Negative (08/08 1540)  Contraception Considering OCPs Rubella 3.94 (08/08 1540)  Circumcision yes RPR Non Reactive (11/20 0933)  Pediatrician  undecided HBsAg Negative (08/08 1540)   Support Person Greig Castilla, adoptive Mom HIV Non Reactive (11/20 4098)  Prenatal Classes yes Varicella     GBS  (For PCN allergy, check sensitivities)   BTL Consent  Hep C Non Reactive (08/08 1540)   VBAC  Consent  Pap No results found for: "DIAGPAP"    Hgb Electro      CF      SMA               Morbid obesity with BMI of 45.0-49.9, adult (HCC) 03/14/2021 by Free, Lindalou Hose, CNM  No   Overview Addendum 09/26/2023  8:02 AM by Burney Gauze, CNM  First/Second Trimester BMI >40 [ ]  1mg  Folic acid [ ]  Baseline PIH labs, thyroid, HgbA1c [ ]  Early 1 hour glucola- WNL 80 [ ]  Anesthesia consult BMI > 50 [ ]  Daily 81 mg ASA (consider 162 mg) at 12 weeks [ ]  Nutrition consult, discuss weight goals < 15 lbs TWG [ ]  Detailed anatomy US  Third Trimester BMI >40 [ ]  Repeat one hour glucola [ ]  Growth [place] q4 weeks starting at 28 weeks [ ]  Anesthesia consult with BMI > 45 [ ]  Weekly NSTs beginning at 34 weeks [ ]  Consider induction by 40 weeks            Preterm labor symptoms and general obstetric precautions including but not limited to vaginal bleeding, contractions, leaking of fluid and fetal movement were reviewed in detail with the patient. Please refer to After Visit Summary for other counseling recommendations.   Return in about 2 weeks (around 11/07/2023) for return OB.  Mirna Mires, CNM  10/24/2023 8:46 AM

## 2023-10-29 ENCOUNTER — Ambulatory Visit (INDEPENDENT_AMBULATORY_CARE_PROVIDER_SITE_OTHER): Payer: BC Managed Care – PPO

## 2023-10-29 VITALS — BP 128/87 | HR 105 | Ht 61.0 in | Wt 260.5 lb

## 2023-10-29 DIAGNOSIS — Z3A32 32 weeks gestation of pregnancy: Secondary | ICD-10-CM | POA: Diagnosis not present

## 2023-10-29 DIAGNOSIS — O10013 Pre-existing essential hypertension complicating pregnancy, third trimester: Secondary | ICD-10-CM | POA: Diagnosis not present

## 2023-10-29 DIAGNOSIS — O10913 Unspecified pre-existing hypertension complicating pregnancy, third trimester: Secondary | ICD-10-CM

## 2023-10-29 NOTE — Progress Notes (Signed)
    NURSE VISIT NOTE  Subjective:    Patient ID: Paige Williamson, female    DOB: 13-Oct-1996, 27 y.o.   MRN: [id number]  HPI  Patient is a 27 y.o. G74P0010 female who presents for fetal monitoring per order from Paula Compton, CNM.   Objective:    BP 128/87   Pulse (!) 105   Ht 5\' 1"  (1.549 m)   Wt 260 lb 8 oz (118.2 kg)   LMP 03/15/2023   BMI 49.22 kg/m  Estimated Date of Delivery: 12/20/23  [redacted]w[redacted]d  Fetus A Non-Stress Test Interpretation for 10/29/23  Indication: Chronic Hypertenstion  Fetal Heart Rate A Mode: External Baseline Rate (A): 125 bpm Variability: Moderate Accelerations: 15 x 15 Decelerations: None Multiple birth?: No  Uterine Activity Mode: Toco Contraction Frequency (min): None  Interpretation (Fetal Testing) Nonstress Test Interpretation: Reactive Overall Impression: Reassuring for gestational age   Assessment:   1. Maternal chronic hypertension, third trimester   2. [redacted] weeks gestation of pregnancy      Plan:   Results reviewed and discussed with patient by  Paula Compton, CNM.     Rocco Serene, LPN

## 2023-10-29 NOTE — Patient Instructions (Signed)

## 2023-11-01 ENCOUNTER — Encounter: Payer: Self-pay | Admitting: Obstetrics

## 2023-11-05 ENCOUNTER — Other Ambulatory Visit: Payer: Self-pay

## 2023-11-05 ENCOUNTER — Other Ambulatory Visit: Payer: Self-pay | Admitting: Obstetrics and Gynecology

## 2023-11-05 ENCOUNTER — Ambulatory Visit: Payer: BC Managed Care – PPO | Attending: Obstetrics and Gynecology

## 2023-11-05 DIAGNOSIS — O99213 Obesity complicating pregnancy, third trimester: Secondary | ICD-10-CM

## 2023-11-05 DIAGNOSIS — O358XX0 Maternal care for other (suspected) fetal abnormality and damage, not applicable or unspecified: Secondary | ICD-10-CM

## 2023-11-05 DIAGNOSIS — O10013 Pre-existing essential hypertension complicating pregnancy, third trimester: Secondary | ICD-10-CM | POA: Diagnosis not present

## 2023-11-05 DIAGNOSIS — Q6689 Other  specified congenital deformities of feet: Secondary | ICD-10-CM | POA: Insufficient documentation

## 2023-11-05 DIAGNOSIS — E669 Obesity, unspecified: Secondary | ICD-10-CM

## 2023-11-05 DIAGNOSIS — Z3A33 33 weeks gestation of pregnancy: Secondary | ICD-10-CM

## 2023-11-05 DIAGNOSIS — Z79899 Other long term (current) drug therapy: Secondary | ICD-10-CM | POA: Insufficient documentation

## 2023-11-05 DIAGNOSIS — O26893 Other specified pregnancy related conditions, third trimester: Secondary | ICD-10-CM | POA: Diagnosis not present

## 2023-11-05 DIAGNOSIS — Z362 Encounter for other antenatal screening follow-up: Secondary | ICD-10-CM

## 2023-11-05 DIAGNOSIS — O10913 Unspecified pre-existing hypertension complicating pregnancy, third trimester: Secondary | ICD-10-CM | POA: Diagnosis present

## 2023-11-05 DIAGNOSIS — O10919 Unspecified pre-existing hypertension complicating pregnancy, unspecified trimester: Secondary | ICD-10-CM

## 2023-11-07 NOTE — L&D Delivery Note (Signed)
 Delivery Note   UBAH RADKE is a 28 y.o. G2P0010 at [redacted]w[redacted]d Estimated Date of Delivery: 12/20/23  PRE-OPERATIVE DIAGNOSIS:  1) [redacted]w[redacted]d pregnancy.  2) Chronic Hypertension with Superimposed Pre-Eclampsia w/SF 3) BMI 49.8 4) Club foot of fetus  POST-OPERATIVE DIAGNOSIS:  1) [redacted]w[redacted]d pregnancy s/p Vaginal, Spontaneous Same as above and  2) Laceration repair   Delivery Type: Vaginal, Spontaneous   Delivery Anesthesia: Epidural  Labor Complications:  none     ESTIMATED BLOOD LOSS: 50 ml    FINDINGS:   1) female infant, Apgar scores pending NICU assignment, birthweight pending    SPECIMENS:   PLACENTA:   Appearance: Intact   Removal: Spontaneous;Pathology     Disposition:  Pathology   CORD BLOOD: Gasses sent, Cord blood typing sent   DISPOSITION:  Infant transported to Neonatal ICU  NARRATIVE SUMMARY: Labor course:  NAVA SONG is a G2P0010 at [redacted]w[redacted]d who presented to Labor & Delivery for induction of labor for superimposed pre-eclampsia with severe features. Labor proceeded with augmentation including use of Cook catheter, misoprostol , pitocin , and AROM. She was found to be completely dilated at 1313. Just prior to being completely dilated, multiple FHR decelerations were noted, followed by a prolonged FHR deceleration to the 80s. During the prolonged FHR deceleration, she was noted to be completely dilated. Dr. Janit was called to the bedside for concern for need for operative delivery. While awaiting his arrival, Hamdi began pushing with CNM and SNM at bedside. With excellent maternal pushing effort, she birthed a viable female infant at 42. There was a tight nuchal cord. The shoulders were birthed without difficulty via somersault maneuver. The infant was placed skin-to-skin with mother. The cord was doubly clamped and cut at about 30 seconds after milking the cord. The NICU team brought infant to radiant warmer for evaluation and resuscitation. See note in infant's chart.  The  placenta delivered spontaneously and was noted to be intact with a 3VC.  A perineal and vaginal examination was performed. Episiotomy/Lacerations: 1st degree Lacerations were repaired with a single figure of eightVicryl suture using epidural anesthesia. The patient tolerated this well. Mother continues on postpartum magnesium  drip per protocol and remains on Labor & Delivery in stable condition. Infant admitted to special care nursery.   Lauraine Range, CNM present for entire course of labor and birth. Dr. Janit at bedside for delivery and aware of patient status.   Vernell VEAR Maxin, Student-MidWife 11/20/2023 2:08 PM

## 2023-11-08 ENCOUNTER — Ambulatory Visit: Payer: BC Managed Care – PPO | Admitting: Obstetrics

## 2023-11-08 ENCOUNTER — Encounter: Payer: Self-pay | Admitting: Obstetrics and Gynecology

## 2023-11-08 ENCOUNTER — Encounter: Payer: Self-pay | Admitting: Obstetrics

## 2023-11-08 ENCOUNTER — Encounter: Payer: Self-pay | Admitting: Certified Nurse Midwife

## 2023-11-08 ENCOUNTER — Observation Stay
Admission: EM | Admit: 2023-11-08 | Discharge: 2023-11-08 | Disposition: A | Discharge status: Home / Self Care | Payer: BC Managed Care – PPO | Attending: Certified Nurse Midwife | Admitting: Certified Nurse Midwife

## 2023-11-08 ENCOUNTER — Other Ambulatory Visit: Payer: Self-pay

## 2023-11-08 VITALS — BP 162/114 | HR 83 | Wt 262.0 lb

## 2023-11-08 DIAGNOSIS — Z3A34 34 weeks gestation of pregnancy: Secondary | ICD-10-CM

## 2023-11-08 DIAGNOSIS — Z3480 Encounter for supervision of other normal pregnancy, unspecified trimester: Secondary | ICD-10-CM

## 2023-11-08 DIAGNOSIS — Z3490 Encounter for supervision of normal pregnancy, unspecified, unspecified trimester: Secondary | ICD-10-CM

## 2023-11-08 DIAGNOSIS — F32A Depression, unspecified: Secondary | ICD-10-CM

## 2023-11-08 DIAGNOSIS — Z79899 Other long term (current) drug therapy: Secondary | ICD-10-CM | POA: Diagnosis not present

## 2023-11-08 DIAGNOSIS — F419 Anxiety disorder, unspecified: Secondary | ICD-10-CM

## 2023-11-08 DIAGNOSIS — O10919 Unspecified pre-existing hypertension complicating pregnancy, unspecified trimester: Secondary | ICD-10-CM

## 2023-11-08 DIAGNOSIS — O10013 Pre-existing essential hypertension complicating pregnancy, third trimester: Principal | ICD-10-CM | POA: Insufficient documentation

## 2023-11-08 DIAGNOSIS — O99343 Other mental disorders complicating pregnancy, third trimester: Secondary | ICD-10-CM

## 2023-11-08 DIAGNOSIS — O35HXX0 Maternal care for other (suspected) fetal abnormality and damage, fetal lower extremities anomalies, not applicable or unspecified: Secondary | ICD-10-CM

## 2023-11-08 DIAGNOSIS — O163 Unspecified maternal hypertension, third trimester: Secondary | ICD-10-CM | POA: Diagnosis not present

## 2023-11-08 DIAGNOSIS — F4321 Adjustment disorder with depressed mood: Secondary | ICD-10-CM

## 2023-11-08 LAB — COMPREHENSIVE METABOLIC PANEL
ALT: 19 U/L (ref 0–44)
AST: 33 U/L (ref 15–41)
Albumin: 2.9 g/dL — ABNORMAL LOW (ref 3.5–5.0)
Alkaline Phosphatase: 190 U/L — ABNORMAL HIGH (ref 38–126)
Anion gap: 10 (ref 5–15)
BUN: 6 mg/dL (ref 6–20)
CO2: 20 mmol/L — ABNORMAL LOW (ref 22–32)
Calcium: 8.9 mg/dL (ref 8.9–10.3)
Chloride: 104 mmol/L (ref 98–111)
Creatinine, Ser: 0.58 mg/dL (ref 0.44–1.00)
GFR, Estimated: >60 mL/min (ref >60)
Glucose, Bld: 94 mg/dL (ref 70–99)
Potassium: 4 mmol/L (ref 3.5–5.1)
Sodium: 134 mmol/L — ABNORMAL LOW (ref 135–145)
Total Bilirubin: 0.4 mg/dL (ref 0.0–1.2)
Total Protein: 6.7 g/dL (ref 6.5–8.1)

## 2023-11-08 LAB — POCT URINALYSIS DIPSTICK OB
Bilirubin, UA: NEGATIVE
Glucose, UA: NEGATIVE
Ketones, UA: NEGATIVE
Leukocytes, UA: NEGATIVE
Nitrite, UA: NEGATIVE
Spec Grav, UA: 1.02 (ref 1.010–1.025)
Urobilinogen, UA: 0.2 U/dL
pH, UA: 6 (ref 5.0–8.0)

## 2023-11-08 LAB — CBC
HCT: 36.3 % (ref 36.0–46.0)
Hemoglobin: 12.1 g/dL (ref 12.0–15.0)
MCH: 28.7 pg (ref 26.0–34.0)
MCHC: 33.3 g/dL (ref 30.0–36.0)
MCV: 86 fL (ref 80.0–100.0)
Platelets: 244 10*3/uL (ref 150–400)
RBC: 4.22 MIL/uL (ref 3.87–5.11)
RDW: 13.8 % (ref 11.5–15.5)
WBC: 12.3 10*3/uL — ABNORMAL HIGH (ref 4.0–10.5)
nRBC: 0 % (ref 0.0–0.2)

## 2023-11-08 LAB — PROTEIN / CREATININE RATIO, URINE
Creatinine, Urine: 141 mg/dL
Protein Creatinine Ratio: 0.21 mg/mg{creat} — ABNORMAL HIGH (ref 0.00–0.15)
Total Protein, Urine: 30 mg/dL

## 2023-11-08 MED ORDER — LABETALOL HCL 100 MG PO TABS: 200.0000 mg | ORAL_TABLET | Freq: Two times a day (BID) | ORAL | 11 refills | Status: DC

## 2023-11-08 NOTE — OB Triage Note (Signed)
 Discharge instructions, labor precautions, and follow-up care reviewed with patient and significant other. All questions answered. Patient verbalized understanding. Discharged ambulatory off unit.

## 2023-11-08 NOTE — Progress Notes (Signed)
    Return Prenatal Note   Assessment/Plan   Plan  28 y.o. G2P0010 at [redacted]w[redacted]d presents for follow-up OB visit. Reviewed prenatal record including previous visit note.  Chronic hypertension affecting pregnancy -Elevated BP at MFM on 11/05/23.  -Severe range in office today -2+ protein in urine -Discussed antenatal surveillance and reasons for IOL prior to 39w -To L&D for preeclampsia evaluation    Orders Placed This Encounter  Procedures   POC Urinalysis Dipstick OB   Return in about 1 week (around 11/15/2023).   Future Appointments  Date Time Provider Department Center  11/12/2023 11:00 AM [facility]  11/15/2023  3:55 PM Leigh Sober, MD [facility] None  11/19/2023  3:00 PM [facility]  11/26/2023  3:00 PM [facility]  12/03/2023  2:30 PM [facility]    For next visit:   ROB, weekly BPPs with MFM    Subjective   Josephene has noticed increased swelling in her lower extremities. She denies HA, visual changes, and epigastric pain. She has been taking her labetalol  daily.  Movement: Present Contractions: Not present  Objective   Flow sheet Vitals: Pulse Rate: 83 BP: (!) 162/114 Fundal Height: 36 cm Fetal Heart Rate (bpm): 130 Total weight gain: 9 lb (4.082 kg)  General Appearance  No acute distress, well appearing, and well nourished Pulmonary   Normal work of breathing Neurologic   Alert and oriented to person, place, and time Psychiatric   Mood and affect within normal limits DTRs: 2+/no clonus  Eleanor Canny, CNM 11/08/23 [phone]

## 2023-11-08 NOTE — OB Triage Provider Note (Signed)
 L&D OB Triage Note  SUBJECTIVE Paige Williamson is a 28 y.o. G2P0010 female at [redacted]w[redacted]d, EDD Estimated Date of Delivery: 12/20/23  with history of chronic hypertension . She was sent from the office for pre eclampsia evaluation due to elevated blood pressure readings in the office. She denies visual changes, epigastric pain , and headache. She is feeling good movement, denies loss of fluid, vaginal bleeding, and contractions.     OB History  Gravida Para Term Preterm AB Living  2 0 0 0 1 0  SAB IAB Ectopic Multiple Live Births  1 0 0 0 0    # Outcome Date GA Lbr Len/2nd Weight Sex Type Anes PTL Lv  2 Current           1 SAB 05/2022     SAB        Birth Comments: D&C done    Medications Prior to Admission  Medication Sig Dispense Refill Last Dose/Taking   Cholecalciferol (VITAMIN D-3 PO) Take 1,000 Units by mouth at bedtime.      escitalopram  (LEXAPRO ) 20 MG tablet Take 1 tablet (20 mg total) by mouth at bedtime. 30 tablet 6    folic acid (FOLVITE) 400 MCG tablet Take 400 mcg by mouth daily.      Prenatal Vit-Fe Fumarate-FA (MULTIVITAMIN-PRENATAL) 27-0.8 MG TABS tablet Take 1 tablet by mouth daily at 12 noon.      [DISCONTINUED] labetalol  (NORMODYNE ) 100 MG tablet Take 100 mg by mouth 2 (two) times daily. Takes one  pill in am and half at night.        OBJECTIVE  Nursing Evaluation:   BP (!) 143/80   Pulse 83   Temp 98.1 F (36.7 C) (Oral)   Resp 15   Ht 5' 1 (1.549 m)   Wt 118.8 kg   LMP 03/15/2023   BMI 49.50 kg/m    Findings:   reactive NST, Negative for pre eclampsia      NST was performed and has been reviewed by me.  NST INTERPRETATION: Category I  Mode: External Baseline Rate (A): 135 bpm Variability: Moderate Accelerations: 15 x 15 Decelerations: None     Contraction Frequency (min): occ  ASSESSMENT Impression:  1.  Pregnancy:  G2P0010 at [redacted]w[redacted]d , EDD Estimated Date of Delivery: 12/20/23 2.  Reassuring fetal and maternal status 3.  Normal  reflexes, negative clonus, Lungs clear bilaterally,heart: RRR.      Latest Ref Rng & Units 11/08/2023    9:28 AM 09/26/2023    9:33 AM 06/14/2023    3:40 PM  CBC  WBC 4.0 - 10.5 K/uL 12.3  11.7  15.0   Hemoglobin 12.0 - 15.0 g/dL 87.8  88.1  87.1   Hematocrit 36.0 - 46.0 % 36.3  35.7  37.3   Platelets 150 - 400 K/uL 244  294  336        Latest Ref Rng & Units 11/08/2023    9:28 AM 06/14/2023    3:40 PM 09/14/2022    9:45 PM  CMP  Glucose 70 - 99 mg/dL 94  81  893   BUN 6 - 20 mg/dL 6  7  10    Creatinine 0.44 - 1.00 mg/dL 9.41  9.35  9.25   Sodium 135 - 145 mmol/L 134  135  138   Potassium 3.5 - 5.1 mmol/L 4.0  4.3  3.9   Chloride 98 - 111 mmol/L 104  100  107   CO2 22 -  32 mmol/L 20  20  25    Calcium  8.9 - 10.3 mg/dL 8.9  9.4  9.3   Total Protein 6.5 - 8.1 g/dL 6.7  6.7  8.2   Total Bilirubin 0.0 - 1.2 mg/dL 0.4  <9.7  0.7   Alkaline Phos 38 - 126 U/L 190  116  112   AST 15 - 41 U/L 33  23  37   ALT 0 - 44 U/L 19  21  51        Component Ref Range & Units (hover) 09:29  Creatinine, Urine 141  Total Protein, Urine 30  Comment: NO NORMAL RANGE ESTABLISHED FOR THIS TEST  Protein Creatinine Ratio 0.21 High      PLAN 1. Current condition and above findings reviewed.  Reassuring fetal and maternal condition. Increase labetalol  dose 200 mg BID.  2. Discharge home with standard labor precautions given to return to L&D or call the office for problems. 3. Follow up in office on Monday for BP check.  4. Dr. Janit consulted and in agreement to plan.   I was present and evaluated this pt in person.   Zelda Hummer, CNM

## 2023-11-08 NOTE — Assessment & Plan Note (Addendum)
-  Elevated BP at MFM on 11/05/23.  -Severe range in office today -2+ protein in urine -Discussed antenatal surveillance and reasons for IOL prior to 39w -To L&D for preeclampsia evaluation

## 2023-11-08 NOTE — OB Triage Note (Signed)
 Patient is a G2P0010 at 34wod who was sent over from the office for a PIH eval for severe range pressures. Patient denies headaches, epigastric pain, and blurry vision. Does report some mild swelling in feet that resolves with rest. Reports +fetal movement, denies contractions, leakage of fluid, and vaginal bleeding. External monitors applied and assessing. Initial FHT 130. Initial BP 158/103, cycling q15min.

## 2023-11-12 ENCOUNTER — Other Ambulatory Visit: Payer: Self-pay

## 2023-11-12 ENCOUNTER — Ambulatory Visit: Payer: BC Managed Care – PPO

## 2023-11-12 ENCOUNTER — Ambulatory Visit: Payer: BC Managed Care – PPO | Attending: Maternal & Fetal Medicine

## 2023-11-12 VITALS — BP 150/101 | HR 101 | Ht 61.0 in | Wt 264.9 lb

## 2023-11-12 DIAGNOSIS — Z0130 Encounter for examination of blood pressure without abnormal findings: Secondary | ICD-10-CM

## 2023-11-12 DIAGNOSIS — O26893 Other specified pregnancy related conditions, third trimester: Secondary | ICD-10-CM | POA: Insufficient documentation

## 2023-11-12 DIAGNOSIS — O99213 Obesity complicating pregnancy, third trimester: Secondary | ICD-10-CM

## 2023-11-12 DIAGNOSIS — Q6689 Other  specified congenital deformities of feet: Secondary | ICD-10-CM | POA: Insufficient documentation

## 2023-11-12 DIAGNOSIS — O10913 Unspecified pre-existing hypertension complicating pregnancy, third trimester: Secondary | ICD-10-CM | POA: Diagnosis present

## 2023-11-12 DIAGNOSIS — O358XX0 Maternal care for other (suspected) fetal abnormality and damage, not applicable or unspecified: Secondary | ICD-10-CM | POA: Diagnosis not present

## 2023-11-12 DIAGNOSIS — Z79899 Other long term (current) drug therapy: Secondary | ICD-10-CM | POA: Insufficient documentation

## 2023-11-12 DIAGNOSIS — Z3A34 34 weeks gestation of pregnancy: Secondary | ICD-10-CM | POA: Diagnosis not present

## 2023-11-12 DIAGNOSIS — O10013 Pre-existing essential hypertension complicating pregnancy, third trimester: Secondary | ICD-10-CM

## 2023-11-12 DIAGNOSIS — O10919 Unspecified pre-existing hypertension complicating pregnancy, unspecified trimester: Secondary | ICD-10-CM

## 2023-11-12 DIAGNOSIS — E669 Obesity, unspecified: Secondary | ICD-10-CM

## 2023-11-12 NOTE — Progress Notes (Signed)
    NURSE VISIT NOTE  Subjective:    Patient ID: Paige Williamson, female    DOB: 1996/03/02, 28 y.o.   MRN: [id number]  HPI  Patient is a 28 y.o. G44P0010 female who presents for BP check per order from Zelda Hummer, CNM.   Patient reports compliance with prescribed BP medications: yes Labetalol  200 mgtwice daily Last dose of BP medication:  11/12/23 at 8 am  BP Readings from Last 3 Encounters:  11/12/23 (!) 142/99  11/08/23 (!) 143/80  11/08/23 (!) 162/114   Pulse Readings from Last 3 Encounters:  11/12/23 93  11/08/23 83  11/08/23 83    Objective:    BP (!) 142/99   Pulse 93   Ht 5' 1 (1.549 m)   Wt 264 lb 14.4 oz (120.2 kg)   LMP 03/15/2023   Breastfeeding No   BMI 50.05 kg/m   Assessment:   1. Maternal chronic hypertension, third trimester      Plan:   Per  Zelda Hummer, CNM:  Continue current treatment regimen. Continue to monitor blood pressure at home. Report any reading >140/90 or with any associated symptoms. Return to clinic as scheduled. Currently going to MFM for weekly NST.  Patient verbalized understanding of instructions.   Paige Williamson, CMA

## 2023-11-15 ENCOUNTER — Ambulatory Visit: Payer: BC Managed Care – PPO | Admitting: Obstetrics

## 2023-11-15 ENCOUNTER — Encounter: Payer: Self-pay | Admitting: Obstetrics

## 2023-11-15 VITALS — BP 149/97 | HR 92 | Wt 264.0 lb

## 2023-11-15 DIAGNOSIS — O35HXX0 Maternal care for other (suspected) fetal abnormality and damage, fetal lower extremities anomalies, not applicable or unspecified: Secondary | ICD-10-CM

## 2023-11-15 DIAGNOSIS — O10013 Pre-existing essential hypertension complicating pregnancy, third trimester: Secondary | ICD-10-CM | POA: Diagnosis not present

## 2023-11-15 DIAGNOSIS — O10919 Unspecified pre-existing hypertension complicating pregnancy, unspecified trimester: Secondary | ICD-10-CM

## 2023-11-15 DIAGNOSIS — O99213 Obesity complicating pregnancy, third trimester: Secondary | ICD-10-CM

## 2023-11-15 DIAGNOSIS — Z0289 Encounter for other administrative examinations: Secondary | ICD-10-CM

## 2023-11-15 DIAGNOSIS — Z3A35 35 weeks gestation of pregnancy: Secondary | ICD-10-CM

## 2023-11-15 NOTE — Patient Instructions (Signed)

## 2023-11-15 NOTE — Progress Notes (Signed)
    Return Prenatal Note   Subjective  28 y.o. G2P0010 at [redacted]w[redacted]d presents for this follow-up prenatal visit.  Pregnancy notable for cHTN on Labetalol , fetal club foot, maternal BMI 49.6.   Patient had BPP with MFM on 1/6, 8/8, but with some elevated BP in their office. Her Labetalol  was increaesed to 400mg  BID and thus far has not had any lab abnormalities. Today pt's BP is elevated ro 149/97. Did take her Labetalol  this morning, denies HA, vision changes, RUQ/epigastric pain or sudden swelling.   Anesthesia consult set up for 1/16 at 9am. NST done in clinic today.   Patient reports: Movement: Present Contractions: Not present Denies vaginal bleeding or leaking fluid. Objective  Flow sheet Vitals: Pulse Rate: 92 BP: (!) 149/97 Fundal Height: 37 cm Fetal Heart Rate (bpm): 140s Total weight gain: 11 lb (4.99 kg)  General Appearance  No acute distress, well appearing, and well nourished Pulmonary   Normal work of breathing Neurologic   Alert and oriented to person, place, and time Psychiatric   Mood and affect within normal limits  Assessment/Plan   Plan  27 y.o. G2P0010 at [redacted]w[redacted]d by LMP=[redacted]w[redacted]d US  presents for follow-up OB visit. Reviewed prenatal record including previous visit note.  1. Chronic hypertension affecting pregnancy - NST reactive today - Weekly PIH labs today, 24hr urine ordered, instructions reviewed - Weekly BPPs and serial growth scheduled with MFM - Per MFM, delivery at 37wks and I concur, based on her acute BP elevations and continued increases in Labetalol  for control; sooner if BP or labs indicate - Scheduled for IOL on 1/25, ([redacted]w[redacted]d) orders entered  2. Morbid obesity with BMI of 45.0-49.9, adult [facility]) - S/p initial anesthesia consult 9/12, follow up scheduled for 1/16 - TWG 11lb thus far  3. Club foot of fetus affecting antepartum care of mother, single or unspecified fetus -Notify peds on delivery   Orders Placed This Encounter  Procedures   CBC    Comp Met (CMET)   Protein / creatinine ratio, urine   Protein, urine, 24 hour    Standing Status:   Future    Expiration Date:   11/14/2024   Creatinine clearance, urine, 24 hour   Return in about 1 week (around 11/22/2023) for rob and nst .   Future Appointments  Date Time Provider Department Center  11/19/2023  9:20 AM [facility] LAB [facility] None  11/19/2023  3:00 PM [facility]  11/22/2023  3:15 PM [facility]-NST ROOM [facility] None  11/22/2023  3:55 PM Leigh Sober, MD [facility] None  11/23/2023  9:00 AM [facility] CONS[facility] None  11/26/2023  3:00 PM [facility]  12/03/2023  2:30 PM [facility]    For next visit:  continue with routine prenatal care   Sober Leigh, DO [facility]

## 2023-11-16 ENCOUNTER — Encounter: Payer: Self-pay | Admitting: Obstetrics

## 2023-11-16 LAB — COMPREHENSIVE METABOLIC PANEL
ALT: 20 [IU]/L (ref 0–32)
AST: 36 [IU]/L (ref 0–40)
Albumin: 3.6 g/dL — ABNORMAL LOW (ref 4.0–5.0)
Alkaline Phosphatase: 247 [IU]/L — ABNORMAL HIGH (ref 44–121)
BUN/Creatinine Ratio: 15 (ref 9–23)
BUN: 9 mg/dL (ref 6–20)
Bilirubin Total: <0.2 mg/dL (ref 0.0–1.2)
CO2: 19 mmol/L — ABNORMAL LOW (ref 20–29)
Calcium: 9.7 mg/dL (ref 8.7–10.2)
Chloride: 102 mmol/L (ref 96–106)
Creatinine, Ser: 0.61 mg/dL (ref 0.57–1.00)
Globulin, Total: 2.7 g/dL (ref 1.5–4.5)
Glucose: 77 mg/dL (ref 70–99)
Potassium: 4.5 mmol/L (ref 3.5–5.2)
Sodium: 136 mmol/L (ref 134–144)
Total Protein: 6.3 g/dL (ref 6.0–8.5)
eGFR: 127 mL/min/{1.73_m2} (ref >59)

## 2023-11-16 LAB — CBC
Hematocrit: 38.3 % (ref 34.0–46.6)
Hemoglobin: 12.9 g/dL (ref 11.1–15.9)
MCH: 28.9 pg (ref 26.6–33.0)
MCHC: 33.7 g/dL (ref 31.5–35.7)
MCV: 86 fL (ref 79–97)
Platelets: 254 10*3/uL (ref 150–450)
RBC: 4.47 x10E6/uL (ref 3.77–5.28)
RDW: 14.1 % (ref 11.7–15.4)
WBC: 11.1 10*3/uL — ABNORMAL HIGH (ref 3.4–10.8)

## 2023-11-18 ENCOUNTER — Observation Stay
Admission: EM | Admit: 2023-11-18 | Discharge: 2023-11-18 | Disposition: A | Discharge status: Home / Self Care | Payer: BC Managed Care – PPO | Source: Home / Self Care | Admitting: Advanced Practice Midwife

## 2023-11-18 ENCOUNTER — Encounter: Payer: Self-pay | Admitting: Obstetrics

## 2023-11-18 ENCOUNTER — Other Ambulatory Visit: Payer: Self-pay

## 2023-11-18 DIAGNOSIS — O10919 Unspecified pre-existing hypertension complicating pregnancy, unspecified trimester: Secondary | ICD-10-CM

## 2023-11-18 DIAGNOSIS — O163 Unspecified maternal hypertension, third trimester: Secondary | ICD-10-CM | POA: Insufficient documentation

## 2023-11-18 DIAGNOSIS — Z3480 Encounter for supervision of other normal pregnancy, unspecified trimester: Principal | ICD-10-CM

## 2023-11-18 DIAGNOSIS — O35HXX0 Maternal care for other (suspected) fetal abnormality and damage, fetal lower extremities anomalies, not applicable or unspecified: Secondary | ICD-10-CM

## 2023-11-18 DIAGNOSIS — O119 Pre-existing hypertension with pre-eclampsia, unspecified trimester: Secondary | ICD-10-CM | POA: Insufficient documentation

## 2023-11-18 DIAGNOSIS — Z3A35 35 weeks gestation of pregnancy: Secondary | ICD-10-CM | POA: Insufficient documentation

## 2023-11-18 LAB — PROTEIN / CREATININE RATIO, URINE
Creatinine, Urine: 102.9 mg/dL
Protein, Ur: 41.2 mg/dL
Protein/Creat Ratio: 400 mg/g{creat} — ABNORMAL HIGH (ref 0–200)

## 2023-11-18 NOTE — Discharge Instructions (Signed)
 Call your provider or return to the hospital for any concerns. Keep your next scheduled routine OB appointment.

## 2023-11-18 NOTE — OB Triage Note (Signed)
 Patient arrived stating she saw her P/C ratio results were high and was told that she would need to be evaluated for earlier IOL. Patient denies HA, visual changes, or epigastric pain. Pt does state she felt dizzy yesterday with a BP of 132/86 so she only took a half dose of her BP med. This morning her BP 157/92 and she took her full dose. Pt denies LOF, vaginal bleeding, or any other concerns. Pt states baby is moving well. Monitors applied and assessing.

## 2023-11-18 NOTE — Discharge Summary (Signed)
 Physician Final Progress Note  Patient ID: Paige Williamson MRN: [id number] DOB/AGE: [birth year] 28 y.o.  Admit date: 11/18/2023 Admitting provider: Slater Rains, CNM Discharge date: 11/18/2023   Admission Diagnoses:  1) intrauterine pregnancy at [redacted]w[redacted]d  2) elevated blood pressure and abnormal lab results  Discharge Diagnoses:  Principal Problem:   Elevated blood pressure affecting pregnancy in third trimester, antepartum Active Problems:   Chronic hypertension with superimposed preeclampsia    History of Present Illness: The patient is a 28 y.o. female G2P0010 at [redacted]w[redacted]d who presents for concern regarding elevated protein creatinine ratio lab result. She was advised by nurse line to present to hospital to discuss possibility of prior to 37 week induction of labor. Current plan is to induce at 37 weeks per MFM. The patient denies headache, visual changes, epigastric pain. She reports good fetal movement and denies contractions, vaginal bleeding or leakage of fluid. I discussed all of this with Dr Leigh. She advises that we will continue to monitor patient in clinic and ok to proceed with 37 week IOL. Patient is currently collecting 24 hour urine and plans to drop off at clinic tomorrow. She is taking Labetalol  although today's dose was taken late.   NST is reactive. Precautions reviewed with patient and her husband. She is discharged to home.  Past Medical History:  Diagnosis Date   Anxiety    GERD (gastroesophageal reflux disease)    OCD (obsessive compulsive disorder)    PCOS (polycystic ovarian syndrome) 01/2023    Past Surgical History:  Procedure Laterality Date   DILATION AND EVACUATION N/A 05/30/2022   Procedure: SUCTION D&C;  Surgeon: Verdon Keen, MD;  Location: [facility] ORS;  Service: Gynecology;  Laterality: N/A;   WISDOM TOOTH EXTRACTION     age 28; all four    No current facility-administered medications on file prior to encounter.   Current Outpatient Medications on  File Prior to Encounter  Medication Sig Dispense Refill   Cholecalciferol (VITAMIN D-3 PO) Take 1,000 Units by mouth at bedtime.     escitalopram  (LEXAPRO ) 20 MG tablet Take 1 tablet (20 mg total) by mouth at bedtime. 30 tablet 6   folic acid (FOLVITE) 400 MCG tablet Take 400 mcg by mouth daily.     labetalol  (NORMODYNE ) 100 MG tablet Take 2 tablets (200 mg total) by mouth 2 (two) times daily. Takes one  pill in am and half at night. (Patient taking differently: Take 400 mg by mouth 2 (two) times daily.) 120 tablet 11   Prenatal Vit-Fe Fumarate-FA (MULTIVITAMIN-PRENATAL) 27-0.8 MG TABS tablet Take 1 tablet by mouth daily at 12 noon.      Allergies  Allergen Reactions   Shrimp Extract Swelling   Other Rash   Shellfish Allergy     Social History   Socioeconomic History   Marital status: Married    Spouse name: Prentice   Number of children: 0   Years of education: 16   Highest education level: Not on file  Occupational History   Occupation: activities press photographer at [employer]  Tobacco Use   Smoking status: Never   Smokeless tobacco: Never  Vaping Use   Vaping status: Never Used  Substance and Sexual Activity   Alcohol use: Not Currently    Comment: socially   Drug use: No   Sexual activity: Yes    Partners: Male    Birth control/protection: Pill  Other Topics Concern   Not on file  Social History Narrative   Not on file  Social Drivers of Corporate Investment Banker Strain: Low Risk  (05/07/2023)   Overall Financial Resource Strain (CARDIA)    Difficulty of Paying Living Expenses: Not hard at all  Food Insecurity: No Food Insecurity (05/07/2023)   Hunger Vital Sign    Worried About Running Out of Food in the Last Year: Never true    Ran Out of Food in the Last Year: Never true  Transportation Needs: No Transportation Needs (05/07/2023)   PRAPARE - Administrator, Civil Service (Medical): No    Lack of Transportation (Non-Medical): No  Physical  Activity: Sufficiently Active (05/07/2023)   Exercise Vital Sign    Days of Exercise per Week: 4 days    Minutes of Exercise per Session: 40 min  Stress: No Stress Concern Present (05/07/2023)   [employer] of Occupational Health - Occupational Stress Questionnaire    Feeling of Stress : Not at all  Social Connections: Socially Integrated (05/07/2023)   Social Connection and Isolation Panel [NHANES]    Frequency of Communication with Friends and Family: More than three times a week    Frequency of Social Gatherings with Friends and Family: Twice a week    Attends Religious Services: More than 4 times per year    Active Member of [employer] or Organizations: Yes    Attends Engineer, Structural: More than 4 times per year    Marital Status: Married  Catering Manager Violence: Not At Risk (05/07/2023)   Humiliation, Afraid, Rape, and Kick questionnaire    Fear of Current or Ex-Partner: No    Emotionally Abused: No    Physically Abused: No    Sexually Abused: No    Family History  Problem Relation Age of Onset   Depression Mother    Hyperlipidemia Father    Gout Father    Healthy Paternal Grandmother    Cancer Paternal Grandfather        Liver   Healthy Half-Brother      Review of Systems  Constitutional:  Negative for chills and fever.  HENT:  Negative for congestion, ear discharge, ear pain, hearing loss, sinus pain and sore throat.   Eyes:  Negative for blurred vision and double vision.  Respiratory:  Negative for cough, shortness of breath and wheezing.   Cardiovascular:  Negative for chest pain, palpitations and leg swelling.  Gastrointestinal:  Negative for abdominal pain, blood in stool, constipation, diarrhea, heartburn, melena, nausea and vomiting.  Genitourinary:  Negative for dysuria, flank pain, frequency, hematuria and urgency.  Musculoskeletal:  Negative for back pain, joint pain and myalgias.  Skin:  Negative for itching and rash.  Neurological:  Negative for  dizziness, tingling, tremors, sensory change, speech change, focal weakness, seizures, loss of consciousness, weakness and headaches.  Endo/Heme/Allergies:  Negative for environmental allergies. Does not bruise/bleed easily.  Psychiatric/Behavioral:  Negative for depression, hallucinations, memory loss, substance abuse and suicidal ideas. The patient is not nervous/anxious and does not have insomnia.      Physical Exam: BP (!) 148/94   Pulse 91   Resp 18   LMP 03/15/2023   Constitutional: Well nourished, well developed female in no acute distress.  HEENT: normal Skin: Warm and dry.  Cardiovascular: Regular rate and rhythm.   Extremity:  trace edema   Respiratory: Clear to auscultation bilateral. Normal respiratory effort Abdomen: FHT present Psych: Alert and Oriented x3. No memory deficits. Normal mood and affect.   Toco: negative Fetal well being: reactive NST, 120 bpm, moderate  variability, +accelerations, -decelerations  Consults: None  Significant Findings/ Diagnostic Studies: none  Procedures: NST  Hospital Course: The patient was admitted to Labor and Delivery Triage for observation.   Discharge Condition: good  Disposition: Discharge disposition: 01-Home or Self Care  Diet: Regular diet  Discharge Activity: Activity as tolerated  Discharge Instructions     Discharge activity:  No Restrictions   Complete by: As directed    Discharge diet:  No restrictions   Complete by: As directed       Allergies as of 11/18/2023       Reactions   Shrimp Extract Swelling   Other Rash   Shellfish Allergy         Medication List     TAKE these medications    escitalopram  20 MG tablet Commonly known as: LEXAPRO  Take 1 tablet (20 mg total) by mouth at bedtime.   folic acid 400 MCG tablet Commonly known as: FOLVITE Take 400 mcg by mouth daily.   labetalol  100 MG tablet Commonly known as: NORMODYNE  Take 2 tablets (200 mg total) by mouth 2 (two) times daily.  Takes one  pill in am and half at night. What changed:  how much to take additional instructions   multivitamin-prenatal 27-0.8 MG Tabs tablet Take 1 tablet by mouth daily at 12 noon.   VITAMIN D-3 PO Take 1,000 Units by mouth at bedtime.         Total time spent taking care of this patient: 20 minutes  Signed: Slater Rains, CNM  11/18/2023, 3:01 PM

## 2023-11-18 NOTE — Progress Notes (Signed)
Patient discharged home, discharge instructions given, patient states understanding. Patient left floor in stable condition, denies any other needs at this time. Patient to keep next scheduled OB appointment 

## 2023-11-19 ENCOUNTER — Encounter: Payer: Self-pay | Admitting: Obstetrics and Gynecology

## 2023-11-19 ENCOUNTER — Telehealth: Payer: Self-pay

## 2023-11-19 ENCOUNTER — Other Ambulatory Visit: Payer: Self-pay

## 2023-11-19 ENCOUNTER — Inpatient Hospital Stay
Admission: AD | Admit: 2023-11-19 | Discharge: 2023-11-23 | DRG: 807 | Disposition: A | Discharge status: Home / Self Care | Payer: BC Managed Care – PPO | Source: Ambulatory Visit

## 2023-11-19 ENCOUNTER — Ambulatory Visit (HOSPITAL_BASED_OUTPATIENT_CLINIC_OR_DEPARTMENT_OTHER): Payer: BC Managed Care – PPO

## 2023-11-19 ENCOUNTER — Other Ambulatory Visit: Payer: BC Managed Care – PPO

## 2023-11-19 DIAGNOSIS — O10013 Pre-existing essential hypertension complicating pregnancy, third trimester: Secondary | ICD-10-CM | POA: Diagnosis not present

## 2023-11-19 DIAGNOSIS — F32A Depression, unspecified: Secondary | ICD-10-CM | POA: Diagnosis present

## 2023-11-19 DIAGNOSIS — O1092 Unspecified pre-existing hypertension complicating childbirth: Secondary | ICD-10-CM | POA: Diagnosis present

## 2023-11-19 DIAGNOSIS — Z3490 Encounter for supervision of normal pregnancy, unspecified, unspecified trimester: Secondary | ICD-10-CM

## 2023-11-19 DIAGNOSIS — O10913 Unspecified pre-existing hypertension complicating pregnancy, third trimester: Secondary | ICD-10-CM | POA: Insufficient documentation

## 2023-11-19 DIAGNOSIS — E669 Obesity, unspecified: Secondary | ICD-10-CM | POA: Diagnosis not present

## 2023-11-19 DIAGNOSIS — O114 Pre-existing hypertension with pre-eclampsia, complicating childbirth: Principal | ICD-10-CM | POA: Diagnosis present

## 2023-11-19 DIAGNOSIS — Z3A35 35 weeks gestation of pregnancy: Secondary | ICD-10-CM

## 2023-11-19 DIAGNOSIS — K219 Gastro-esophageal reflux disease without esophagitis: Secondary | ICD-10-CM | POA: Diagnosis present

## 2023-11-19 DIAGNOSIS — O358XX0 Maternal care for other (suspected) fetal abnormality and damage, not applicable or unspecified: Secondary | ICD-10-CM

## 2023-11-19 DIAGNOSIS — F419 Anxiety disorder, unspecified: Secondary | ICD-10-CM | POA: Diagnosis present

## 2023-11-19 DIAGNOSIS — O99344 Other mental disorders complicating childbirth: Secondary | ICD-10-CM | POA: Diagnosis present

## 2023-11-19 DIAGNOSIS — O1002 Pre-existing essential hypertension complicating childbirth: Secondary | ICD-10-CM | POA: Diagnosis not present

## 2023-11-19 DIAGNOSIS — O9962 Diseases of the digestive system complicating childbirth: Secondary | ICD-10-CM | POA: Diagnosis present

## 2023-11-19 DIAGNOSIS — Z79899 Other long term (current) drug therapy: Secondary | ICD-10-CM | POA: Insufficient documentation

## 2023-11-19 DIAGNOSIS — O119 Pre-existing hypertension with pre-eclampsia, unspecified trimester: Secondary | ICD-10-CM

## 2023-11-19 DIAGNOSIS — O35HXX0 Maternal care for other (suspected) fetal abnormality and damage, fetal lower extremities anomalies, not applicable or unspecified: Secondary | ICD-10-CM | POA: Diagnosis present

## 2023-11-19 DIAGNOSIS — E282 Polycystic ovarian syndrome: Secondary | ICD-10-CM | POA: Diagnosis present

## 2023-11-19 DIAGNOSIS — O10919 Unspecified pre-existing hypertension complicating pregnancy, unspecified trimester: Secondary | ICD-10-CM

## 2023-11-19 DIAGNOSIS — O99213 Obesity complicating pregnancy, third trimester: Secondary | ICD-10-CM

## 2023-11-19 DIAGNOSIS — O99214 Obesity complicating childbirth: Secondary | ICD-10-CM | POA: Diagnosis present

## 2023-11-19 DIAGNOSIS — Z3480 Encounter for supervision of other normal pregnancy, unspecified trimester: Principal | ICD-10-CM

## 2023-11-19 LAB — CBC
HCT: 38.3 % (ref 36.0–46.0)
HCT: 37.5 % (ref 36.0–46.0)
Hemoglobin: 13.2 g/dL (ref 12.0–15.0)
Hemoglobin: 12.7 g/dL (ref 12.0–15.0)
MCH: 29.3 pg (ref 26.0–34.0)
MCH: 29.1 pg (ref 26.0–34.0)
MCHC: 34.5 g/dL (ref 30.0–36.0)
MCHC: 33.9 g/dL (ref 30.0–36.0)
MCV: 84.9 fL (ref 80.0–100.0)
MCV: 85.8 fL (ref 80.0–100.0)
Platelets: 192 10*3/uL (ref 150–400)
Platelets: 187 10*3/uL (ref 150–400)
RBC: 4.51 MIL/uL (ref 3.87–5.11)
RBC: 4.37 MIL/uL (ref 3.87–5.11)
RDW: 14.3 % (ref 11.5–15.5)
RDW: 14.2 % (ref 11.5–15.5)
WBC: 14 10*3/uL — ABNORMAL HIGH (ref 4.0–10.5)
WBC: 15.9 10*3/uL — ABNORMAL HIGH (ref 4.0–10.5)
nRBC: 0 % (ref 0.0–0.2)
nRBC: 0 % (ref 0.0–0.2)

## 2023-11-19 LAB — COMPREHENSIVE METABOLIC PANEL
ALT: 48 U/L — ABNORMAL HIGH (ref 0–44)
ALT: 50 U/L — ABNORMAL HIGH (ref 0–44)
AST: 69 U/L — ABNORMAL HIGH (ref 15–41)
AST: 76 U/L — ABNORMAL HIGH (ref 15–41)
Albumin: 2.9 g/dL — ABNORMAL LOW (ref 3.5–5.0)
Albumin: 2.9 g/dL — ABNORMAL LOW (ref 3.5–5.0)
Alkaline Phosphatase: 205 U/L — ABNORMAL HIGH (ref 38–126)
Alkaline Phosphatase: 186 U/L — ABNORMAL HIGH (ref 38–126)
Anion gap: 11 (ref 5–15)
Anion gap: 11 (ref 5–15)
BUN: 9 mg/dL (ref 6–20)
BUN: 8 mg/dL (ref 6–20)
CO2: 21 mmol/L — ABNORMAL LOW (ref 22–32)
CO2: 22 mmol/L (ref 22–32)
Calcium: 9.3 mg/dL (ref 8.9–10.3)
Calcium: 9.3 mg/dL (ref 8.9–10.3)
Chloride: 100 mmol/L (ref 98–111)
Chloride: 101 mmol/L (ref 98–111)
Creatinine, Ser: 0.64 mg/dL (ref 0.44–1.00)
Creatinine, Ser: 0.65 mg/dL (ref 0.44–1.00)
GFR, Estimated: >60 mL/min (ref >60)
GFR, Estimated: >60 mL/min (ref >60)
Glucose, Bld: 81 mg/dL (ref 70–99)
Glucose, Bld: 113 mg/dL — ABNORMAL HIGH (ref 70–99)
Potassium: 3.9 mmol/L (ref 3.5–5.1)
Potassium: 3.8 mmol/L (ref 3.5–5.1)
Sodium: 132 mmol/L — ABNORMAL LOW (ref 135–145)
Sodium: 134 mmol/L — ABNORMAL LOW (ref 135–145)
Total Bilirubin: 0.5 mg/dL (ref 0.0–1.2)
Total Bilirubin: 0.5 mg/dL (ref 0.0–1.2)
Total Protein: 6.8 g/dL (ref 6.5–8.1)
Total Protein: 6.5 g/dL (ref 6.5–8.1)

## 2023-11-19 LAB — TYPE AND SCREEN
ABO/RH(D): O POS
Antibody Screen: NEGATIVE

## 2023-11-19 LAB — PROTEIN / CREATININE RATIO, URINE
Creatinine, Urine: 72 mg/dL
Protein Creatinine Ratio: 0.82 mg/mg{creat} — ABNORMAL HIGH (ref 0.00–0.15)
Total Protein, Urine: 59 mg/dL

## 2023-11-19 MED ORDER — MISOPROSTOL 25 MCG QUARTER TABLET
25.0000 ug | ORAL_TABLET | Freq: Once | ORAL | Status: DC
  Filled 2023-11-19: qty 1

## 2023-11-19 MED ORDER — NIFEDIPINE 10 MG PO CAPS
20.0000 mg | ORAL_CAPSULE | Freq: Once | ORAL | Status: AC
  Administered 2023-11-19: 20 mg via ORAL

## 2023-11-19 MED ORDER — MAGNESIUM SULFATE 40 GM/1000ML IV SOLN
2.0000 g/h | INTRAVENOUS | Status: DC
  Administered 2023-11-19 – 2023-11-20 (×2): 2 g/h via INTRAVENOUS
  Filled 2023-11-19: qty 1000

## 2023-11-19 MED ORDER — HYDRALAZINE HCL 20 MG/ML IJ SOLN: 10.0000 mg | INTRAMUSCULAR | Status: DC | PRN

## 2023-11-19 MED ORDER — LABETALOL HCL 5 MG/ML IV SOLN
INTRAVENOUS | Status: AC
  Filled 2023-11-19: qty 4

## 2023-11-19 MED ORDER — ESCITALOPRAM OXALATE 10 MG PO TABS
20.0000 mg | ORAL_TABLET | Freq: Every day | ORAL | Status: DC
  Administered 2023-11-20 – 2023-11-21 (×2): 20 mg via ORAL
  Filled 2023-11-19: qty 1
  Filled 2023-11-19: qty 2

## 2023-11-19 MED ORDER — AMMONIA AROMATIC IN INHA
RESPIRATORY_TRACT | Status: AC
  Filled 2023-11-19: qty 10

## 2023-11-19 MED ORDER — LIDOCAINE HCL (PF) 1 % IJ SOLN
INTRAMUSCULAR | Status: AC
  Filled 2023-11-19: qty 30

## 2023-11-19 MED ORDER — OXYTOCIN-SODIUM CHLORIDE 30-0.9 UT/500ML-% IV SOLN
2.5000 [IU]/h | INTRAVENOUS | Status: DC
  Administered 2023-11-20: 2.5 [IU]/h via INTRAVENOUS

## 2023-11-19 MED ORDER — FENTANYL CITRATE (PF) 100 MCG/2ML IJ SOLN
50.0000 ug | INTRAMUSCULAR | Status: DC | PRN
Start: 2023-11-19 — End: 2023-11-20
  Administered 2023-11-20: 50 ug via INTRAVENOUS
  Administered 2023-11-20: 100 ug via INTRAVENOUS
  Filled 2023-11-19 (×2): qty 2

## 2023-11-19 MED ORDER — NIFEDIPINE 10 MG PO CAPS
ORAL_CAPSULE | ORAL | Status: AC
  Filled 2023-11-19: qty 2

## 2023-11-19 MED ORDER — MISOPROSTOL 50MCG HALF TABLET
50.0000 ug | ORAL_TABLET | Freq: Once | ORAL | Status: AC
  Administered 2023-11-19: 50 ug via VAGINAL

## 2023-11-19 MED ORDER — CALCIUM GLUCONATE 10 % IV SOLN
INTRAVENOUS | Status: AC
  Filled 2023-11-19: qty 10

## 2023-11-19 MED ORDER — MISOPROSTOL 25 MCG QUARTER TABLET: 25.0000 ug | ORAL_TABLET | Freq: Once | ORAL | Status: DC

## 2023-11-19 MED ORDER — LABETALOL HCL 100 MG PO TABS
400.0000 mg | ORAL_TABLET | Freq: Two times a day (BID) | ORAL | Status: DC
  Administered 2023-11-20: 400 mg via ORAL
  Filled 2023-11-19 (×2): qty 4

## 2023-11-19 MED ORDER — CALCIUM CARBONATE ANTACID 500 MG PO CHEW: 2.0000 | CHEWABLE_TABLET | Freq: Three times a day (TID) | ORAL | Status: DC | PRN

## 2023-11-19 MED ORDER — LABETALOL HCL 100 MG PO TABS: 100.0000 mg | ORAL_TABLET | Freq: Two times a day (BID) | ORAL | Status: DC

## 2023-11-19 MED ORDER — OXYTOCIN-SODIUM CHLORIDE 30-0.9 UT/500ML-% IV SOLN
1.0000 m[IU]/min | INTRAVENOUS | Status: DC
  Administered 2023-11-20: 2 m[IU]/min via INTRAVENOUS

## 2023-11-19 MED ORDER — LABETALOL HCL 100 MG PO TABS
ORAL_TABLET | ORAL | Status: AC
  Administered 2023-11-19: 400 mg via ORAL
  Filled 2023-11-19: qty 4

## 2023-11-19 MED ORDER — SOD CITRATE-CITRIC ACID 500-334 MG/5ML PO SOLN: 30.0000 mL | ORAL | Status: DC | PRN

## 2023-11-19 MED ORDER — ONDANSETRON HCL 4 MG/2ML IJ SOLN: 4.0000 mg | Freq: Four times a day (QID) | INTRAMUSCULAR | Status: DC | PRN

## 2023-11-19 MED ORDER — ACETAMINOPHEN 500 MG PO TABS
1000.0000 mg | ORAL_TABLET | Freq: Four times a day (QID) | ORAL | Status: DC | PRN
  Administered 2023-11-19: 1000 mg via ORAL
  Filled 2023-11-19: qty 2

## 2023-11-19 MED ORDER — LACTATED RINGERS IV SOLN: INTRAVENOUS | Status: AC

## 2023-11-19 MED ORDER — LACTATED RINGERS IV SOLN
500.0000 mL | INTRAVENOUS | Status: AC | PRN
  Administered 2023-11-19: 1000 mL via INTRAVENOUS
  Administered 2023-11-20: 250 mL via INTRAVENOUS

## 2023-11-19 MED ORDER — LABETALOL HCL 100 MG PO TABS: 400.0000 mg | ORAL_TABLET | Freq: Two times a day (BID) | ORAL | Status: DC

## 2023-11-19 MED ORDER — MISOPROSTOL 50MCG HALF TABLET
50.0000 ug | ORAL_TABLET | ORAL | Status: DC | PRN
  Administered 2023-11-19: 50 ug via ORAL
  Filled 2023-11-19 (×2): qty 1

## 2023-11-19 MED ORDER — MISOPROSTOL 25 MCG QUARTER TABLET
25.0000 ug | ORAL_TABLET | Freq: Once | ORAL | Status: AC
  Administered 2023-11-19: 25 ug via ORAL

## 2023-11-19 MED ORDER — OXYTOCIN 10 UNIT/ML IJ SOLN
INTRAMUSCULAR | Status: AC
  Filled 2023-11-19: qty 2

## 2023-11-19 MED ORDER — BETAMETHASONE SOD PHOS & ACET 6 (3-3) MG/ML IJ SUSP
12.0000 mg | INTRAMUSCULAR | Status: DC
  Administered 2023-11-19: 12 mg via INTRAMUSCULAR
  Filled 2023-11-19: qty 5

## 2023-11-19 MED ORDER — MAGNESIUM SULFATE BOLUS VIA INFUSION
4.0000 g | Freq: Once | INTRAVENOUS | Status: AC
  Administered 2023-11-19: 4 g via INTRAVENOUS
  Filled 2023-11-19: qty 1000

## 2023-11-19 MED ORDER — HYDROXYZINE HCL 25 MG PO TABS: 50.0000 mg | ORAL_TABLET | Freq: Four times a day (QID) | ORAL | Status: DC | PRN

## 2023-11-19 MED ORDER — OXYTOCIN-SODIUM CHLORIDE 30-0.9 UT/500ML-% IV SOLN
INTRAVENOUS | Status: AC
  Filled 2023-11-19: qty 500

## 2023-11-19 MED ORDER — LABETALOL HCL 5 MG/ML IV SOLN: 40.0000 mg | INTRAVENOUS | Status: DC | PRN

## 2023-11-19 MED ORDER — LABETALOL HCL 5 MG/ML IV SOLN: 80.0000 mg | INTRAVENOUS | Status: DC | PRN

## 2023-11-19 MED ORDER — TERBUTALINE SULFATE 1 MG/ML IJ SOLN: 0.2500 mg | Freq: Once | INTRAMUSCULAR | Status: DC | PRN

## 2023-11-19 MED ORDER — DIPHENHYDRAMINE HCL 25 MG PO CAPS
50.0000 mg | ORAL_CAPSULE | Freq: Four times a day (QID) | ORAL | Status: DC | PRN
  Administered 2023-11-19: 50 mg via ORAL
  Filled 2023-11-19: qty 2

## 2023-11-19 MED ORDER — LIDOCAINE HCL (PF) 1 % IJ SOLN: 30.0000 mL | INTRAMUSCULAR | Status: DC | PRN

## 2023-11-19 MED ORDER — LABETALOL HCL 5 MG/ML IV SOLN
20.0000 mg | INTRAVENOUS | Status: DC | PRN
  Administered 2023-11-20 – 2023-11-22 (×2): 20 mg via INTRAVENOUS
  Filled 2023-11-19: qty 4

## 2023-11-19 MED ORDER — MISOPROSTOL 200 MCG PO TABS
ORAL_TABLET | ORAL | Status: AC
  Filled 2023-11-19: qty 4

## 2023-11-19 MED ORDER — OXYTOCIN BOLUS FROM INFUSION
333.0000 mL | Freq: Once | INTRAVENOUS | Status: AC
  Administered 2023-11-20: 333 mL via INTRAVENOUS

## 2023-11-19 NOTE — Telephone Encounter (Signed)
 Chart reviewed. Patient reported to L&D as directed by Access Nurse.

## 2023-11-19 NOTE — Progress Notes (Signed)
 Pt agreed to receive IV access and medication after multiple elevated Bps >160/110 and education from medical team.

## 2023-11-19 NOTE — Telephone Encounter (Signed)
 Marland Kitchen

## 2023-11-19 NOTE — Progress Notes (Signed)
 Pt presented to L/D triage from MFM for NST after BPP 6/8. Pt diagnosed with pre-eclampsia and seen in triage yesterday for PIH eval. She reports no bleeding or LOF and positive fetal movement. Pt reports intermittent pains under her right rib-states she feels it is her baby. +2 reflexes, no clonus. Monitors applied and assessing.  Initial BP 164/101. Cycling q15 min. CNM notified and orders placed.  She is currently on labetalol .

## 2023-11-19 NOTE — Progress Notes (Signed)
 Paige Williamson is a 28 y.o. G2P0010 at [redacted]w[redacted]d by LMP admitted for induction of labor due to [facility] superimposed preeclampsia with severe features.  Subjective: Comfortable   Objective: BP 115/63   Pulse (!) 101   Temp 97.8 F (36.6 C) (Oral)   Ht 5' 1 (1.549 m)   Wt 119.7 kg   LMP 03/15/2023   BMI 49.88 kg/m  No intake/output data recorded. No intake/output data recorded.  FHT:  FHR: 125 bpm, variability: moderate,  accelerations:  Present,  decelerations:  Absent UC:   irregular, every 2-5 minutes SVE:   Dilation: 1 Effacement (%): 50 Station: Ballotable Exam by:: L.Tura Roller  Labs: Lab Results  Component Value Date   WBC 14.0 (H) 11/19/2023   HGB 13.2 11/19/2023   HCT 38.3 11/19/2023   MCV 84.9 11/19/2023   PLT 192 11/19/2023    Assessment / Plan: IOL secondary to   [facility] superimposed preeclampsia with severe features   Labor: Ripening stage, cook's catheter easily placed just now, Cytotec  given at 1949 Preeclampsia:  on magnesium  sulfate and no signs or symptoms of toxicity Fetal Wellbeing:  Category I Pain Control:   planning epidural  I/D:   GBS pending, Membranes Intact Anticipated MOD:  NSVD  [name] Brailey Buescher, CNM 11/19/2023, 9:14 PM

## 2023-11-19 NOTE — H&P (Signed)
 Paige Williamson is a 28 y.o. female presenting for a NST. Paige Williamson was seen today at MFM for [facility] with superimposed preeclampsia, she had a BPP 6/8 and an elevated Blood pressure reading. She was then sent to triage to for a NST. Once in triage she was found to have several  severe range blood pressures readings.  She reported she normally takes her evening dose of Labetalol  (200 to 400mg ) at around 5pm, her blood pressure is always 160's so she will take 400mg  of Labetalol  and over time her blood pressure comes down.  She currently denies any HA, visual disturbances or RUQ pain. She does get light headed or dizzy when her systolic blood pressure falls below 130's.  She was given 400mg  Labetalol  and Procardia  20mg  IR.  Her BP's are now WNL. Her UPC was found to be .82. With the severe range blood pressures and elevated UPC she now has preeclampsia with severe features and is recommended to stay for induction.   Paige Williamson started her prenatal care  at 13 weeks. She has had regular prenatal with MFM follow up. Her pregnancy has been complicated by [facility] with superimposed preeclampsia with severe features.   OB History     Gravida  2   Para      Term      Preterm      AB  1   Living         SAB  1   IAB      Ectopic      Multiple      Live Births             Past Medical History:  Diagnosis Date   Anxiety    GERD (gastroesophageal reflux disease)    OCD (obsessive compulsive disorder)    PCOS (polycystic ovarian syndrome) 01/2023   Past Surgical History:  Procedure Laterality Date   DILATION AND EVACUATION N/A 05/30/2022   Procedure: SUCTION D&C;  Surgeon: Verdon Keen, MD;  Location: [facility] ORS;  Service: Gynecology;  Laterality: N/A;   WISDOM TOOTH EXTRACTION     age 34; all four   Family History: family history includes Cancer in her paternal grandfather; Depression in her mother; Gout in her father; Healthy in her half-brother and paternal grandmother; Hyperlipidemia in her  father. Social History:  reports that she has never smoked. She has never used smokeless tobacco. She reports that she does not currently use alcohol. She reports that she does not use drugs.     Maternal Diabetes: No Genetic Screening: Normal Maternal Ultrasounds/Referrals: Normal Fetal Ultrasounds or other Referrals:  Referred to Materal Fetal Medicine  Maternal Substance Abuse:  No Significant Maternal Medications:  Meds include: Other: Labetalol  400mg  BID, Lexapro  20mg ,  Significant Maternal Lab Results:  Other: UPC .82 Number of Prenatal Visits:greater than 3 verified prenatal visits Maternal Vaccinations:RSV: Given during pregnancy >/=14 days ago, TDap, and Covid Other Comments:  None  Review of Systems History   Blood pressure (!) 140/74, pulse (!) 102, temperature 97.8 F (36.6 C), temperature source Oral, height 5' 1 (1.549 m), weight 119.7 kg, last menstrual period 03/15/2023. Exam Physical Exam Constitutional:      Appearance: Normal appearance.  HENT:     Head: Normocephalic.     Mouth/Throat:     Mouth: Mucous membranes are moist.  Cardiovascular:     Rate and Rhythm: Normal rate and regular rhythm.     Pulses: Normal pulses.     Heart sounds: Normal heart  sounds.  Pulmonary:     Effort: Pulmonary effort is normal.     Breath sounds: Normal breath sounds.  Abdominal:     Tenderness: There is no abdominal tenderness.     Comments: Gravid EFW 5lbs  Musculoskeletal:     Cervical back: Normal range of motion and neck supple.     Right lower leg: Edema present.     Left lower leg: Edema present.     Comments: Swelling in hands   Skin:    General: Skin is warm.  Neurological:     General: No focal deficit present.     Mental Status: She is alert.     Gait: Gait normal.     Deep Tendon Reflexes: Reflexes normal.  Psychiatric:        Mood and Affect: Mood normal.    EFM: baseline 125, moderate variability, pos accel, neg dcel  TOCO: rare contractions    SVE 1/50/ballotable  Prenatal labs: ABO, Rh: O/Positive/-- (08/08 1540) Antibody: Negative (08/08 1540) Rubella: 3.94 (08/08 1540) RPR: Non Reactive (11/20 0933)  HBsAg: Negative (08/08 1540)  HIV: Non Reactive (11/20 0933)  GBS:     Assessment/Plan: G2P0010 at [id number] with CHTN superimposed preeclampsia with severe features.   -Admit, Cytotec  ordered, will place ripening balloon when able -Magnesium  ordered  -GBS pending -Betamethasone  ordered -NICU consult -category I tracing -Pain management: planning epidural   Dr Janit aware of pt's status and plan   Paige Williamson, CNM   [facility]  11/19/2023 7:38 PM      Paige Williamson 11/19/2023, 7:06 PM

## 2023-11-20 ENCOUNTER — Inpatient Hospital Stay: Payer: BC Managed Care – PPO | Admitting: Anesthesiology

## 2023-11-20 ENCOUNTER — Encounter: Payer: Self-pay | Admitting: Obstetrics

## 2023-11-20 DIAGNOSIS — Z3A35 35 weeks gestation of pregnancy: Secondary | ICD-10-CM

## 2023-11-20 DIAGNOSIS — F32A Depression, unspecified: Secondary | ICD-10-CM

## 2023-11-20 DIAGNOSIS — O358XX0 Maternal care for other (suspected) fetal abnormality and damage, not applicable or unspecified: Secondary | ICD-10-CM

## 2023-11-20 DIAGNOSIS — O1002 Pre-existing essential hypertension complicating childbirth: Secondary | ICD-10-CM

## 2023-11-20 DIAGNOSIS — O99344 Other mental disorders complicating childbirth: Secondary | ICD-10-CM

## 2023-11-20 DIAGNOSIS — O114 Pre-existing hypertension with pre-eclampsia, complicating childbirth: Principal | ICD-10-CM

## 2023-11-20 DIAGNOSIS — F419 Anxiety disorder, unspecified: Secondary | ICD-10-CM

## 2023-11-20 LAB — PROTEIN, URINE, 24 HOUR
Protein, 24H Urine: 837 mg/(24.h) — ABNORMAL HIGH (ref 30–150)
Protein, Ur: 46.5 mg/dL

## 2023-11-20 LAB — RPR: RPR Ser Ql: NONREACTIVE

## 2023-11-20 MED ORDER — ACETAMINOPHEN 325 MG PO TABS: 650.0000 mg | ORAL_TABLET | ORAL | Status: DC | PRN

## 2023-11-20 MED ORDER — LIDOCAINE-EPINEPHRINE (PF) 1.5 %-1:200000 IJ SOLN
INTRAMUSCULAR | Status: DC | PRN
  Administered 2023-11-20: 3 mL via EPIDURAL

## 2023-11-20 MED ORDER — PHENYLEPHRINE 80 MCG/ML (10ML) SYRINGE FOR IV PUSH (FOR BLOOD PRESSURE SUPPORT): 80.0000 ug | PREFILLED_SYRINGE | INTRAVENOUS | Status: DC | PRN

## 2023-11-20 MED ORDER — BUPIVACAINE HCL (PF) 0.25 % IJ SOLN
INTRAMUSCULAR | Status: DC | PRN
  Administered 2023-11-20 (×2): 4 mL via EPIDURAL

## 2023-11-20 MED ORDER — EPHEDRINE 5 MG/ML INJ: 10.0000 mg | INTRAVENOUS | Status: DC | PRN

## 2023-11-20 MED ORDER — LACTATED RINGERS IV SOLN
500.0000 mL | Freq: Once | INTRAVENOUS | Status: AC
  Administered 2023-11-20: 500 mL via INTRAVENOUS

## 2023-11-20 MED ORDER — LABETALOL HCL 200 MG PO TABS
200.0000 mg | ORAL_TABLET | Freq: Two times a day (BID) | ORAL | Status: DC
  Administered 2023-11-20 – 2023-11-22 (×4): 200 mg via ORAL
  Filled 2023-11-20 (×2): qty 1

## 2023-11-20 MED ORDER — BENZOCAINE-MENTHOL 20-0.5 % EX AERO: 1.0000 | INHALATION_SPRAY | CUTANEOUS | Status: DC | PRN

## 2023-11-20 MED ORDER — DIPHENHYDRAMINE HCL 25 MG PO CAPS: 25.0000 mg | ORAL_CAPSULE | Freq: Four times a day (QID) | ORAL | Status: DC | PRN

## 2023-11-20 MED ORDER — FENTANYL-BUPIVACAINE-NACL 0.5-0.125-0.9 MG/250ML-% EP SOLN
12.0000 mL/h | EPIDURAL | Status: DC | PRN
  Administered 2023-11-20: 12 mL/h via EPIDURAL

## 2023-11-20 MED ORDER — PENICILLIN G POT IN DEXTROSE 60000 UNIT/ML IV SOLN: 3.0000 10*6.[IU] | INTRAVENOUS | Status: DC

## 2023-11-20 MED ORDER — OXYTOCIN-SODIUM CHLORIDE 30-0.9 UT/500ML-% IV SOLN
2.5000 [IU]/h | INTRAVENOUS | Status: DC | PRN
Start: 2023-11-20 — End: 2023-11-23

## 2023-11-20 MED ORDER — IBUPROFEN 600 MG PO TABS
ORAL_TABLET | ORAL | Status: AC
  Administered 2023-11-20: 600 mg
  Filled 2023-11-20: qty 1

## 2023-11-20 MED ORDER — LIDOCAINE HCL (PF) 1 % IJ SOLN
INTRAMUSCULAR | Status: DC | PRN
  Administered 2023-11-20: 3 mL via SUBCUTANEOUS

## 2023-11-20 MED ORDER — SIMETHICONE 80 MG PO CHEW: 80.0000 mg | CHEWABLE_TABLET | ORAL | Status: DC | PRN

## 2023-11-20 MED ORDER — FENTANYL-BUPIVACAINE-NACL 0.5-0.125-0.9 MG/250ML-% EP SOLN
EPIDURAL | Status: AC
  Filled 2023-11-20: qty 250

## 2023-11-20 MED ORDER — PRENATAL MULTIVITAMIN CH
1.0000 | ORAL_TABLET | Freq: Every day | ORAL | Status: DC
  Administered 2023-11-21 – 2023-11-23 (×3): 1 via ORAL
  Filled 2023-11-20 (×3): qty 1

## 2023-11-20 MED ORDER — IBUPROFEN 600 MG PO TABS
600.0000 mg | ORAL_TABLET | Freq: Four times a day (QID) | ORAL | Status: DC
  Administered 2023-11-21 – 2023-11-23 (×9): 600 mg via ORAL
  Filled 2023-11-20 (×10): qty 1

## 2023-11-20 MED ORDER — SODIUM CHLORIDE 0.9 % IV SOLN
5.0000 10*6.[IU] | Freq: Once | INTRAVENOUS | Status: AC
  Administered 2023-11-20: 5 10*6.[IU] via INTRAVENOUS
  Filled 2023-11-20: qty 5

## 2023-11-20 MED ORDER — LACTATED RINGERS AMNIOINFUSION
INTRAVENOUS | Status: DC
  Filled 2023-11-20: qty 1000

## 2023-11-20 MED ORDER — DOCUSATE SODIUM 100 MG PO CAPS
100.0000 mg | ORAL_CAPSULE | Freq: Two times a day (BID) | ORAL | Status: DC
  Administered 2023-11-21 – 2023-11-23 (×5): 100 mg via ORAL
  Filled 2023-11-20 (×5): qty 1

## 2023-11-20 MED ORDER — DIPHENHYDRAMINE HCL 50 MG/ML IJ SOLN: 12.5000 mg | INTRAMUSCULAR | Status: DC | PRN

## 2023-11-20 NOTE — Plan of Care (Signed)
   Problem: Education: Goal: Knowledge of Childbirth will improve Outcome: Completed/Met Goal: Ability to make informed decisions regarding treatment and plan of care will improve Outcome: Completed/Met Goal: Ability to state and carry out methods to decrease the pain will improve Outcome: Completed/Met   Problem: Coping: Goal: Ability to verbalize concerns and feelings about labor and delivery will improve Outcome: Completed/Met   Problem: Life Cycle: Goal: Ability to make normal progression through stages of labor will improve Outcome: Completed/Met Goal: Ability to effectively push during vaginal delivery will improve Outcome: Completed/Met   Problem: Role Relationship: Goal: Will demonstrate positive interactions with the child Outcome: Completed/Met   Problem: Safety: Goal: Risk of complications during labor and delivery will decrease Outcome: Completed/Met   Problem: Pain Management: Goal: Relief or control of pain from uterine contractions will improve Outcome: Completed/Met

## 2023-11-20 NOTE — Discharge Summary (Signed)
Postpartum Discharge Summary   Patient Name: Paige Williamson DOB: Feb 14, 1996 MRN: [id number]  Date of admission: 11/19/2023 Delivery date:11/20/2023 Delivering provider: FREE, Lindalou Hose Date of discharge: 11/23/2023  Admitting diagnosis: Chronic hypertension in pregnancy [O10.919] Intrauterine pregnancy: [redacted]w[redacted]d     Secondary diagnosis:  Principal Problem:   Chronic hypertension in pregnancy Active Problems:   Anxiety and depression   Encounter for supervision of normal pregnancy   PCOS (polycystic ovarian syndrome)   Chronic hypertension affecting pregnancy   Morbid obesity with BMI of 45.0-49.9, adult (HCC)   Club foot of fetus affecting antepartum care of mother    Discharge diagnosis: Preterm Pregnancy Delivered and CHTN with superimposed preeclampsia                                              Post partum procedures: none Augmentation: AROM, Pitocin, Cytotec, and IP Foley Complications: None  Hospital course: Induction of Labor With Vaginal Delivery   28 y.o. yo G2P0111 at [redacted]w[redacted]d was admitted to the hospital 11/19/2023 for induction of labor.  Indication for induction:  cHTN with super-imposed preeclampsia with severe features .  Patient had an uncomplicated labor course. Membrane Rupture Time/Date: 10:23 AM,11/20/2023  Delivery Method:Vaginal, Spontaneous Operative Delivery:N/A Episiotomy: None Lacerations:  1st degree Details of delivery can be found in separate delivery note.  Patient had a postpartum course complicated by elevated blood pressures & labetalol was discontinued, Procardia initiated. Patient is discharged home 11/23/23.  Newborn Data: Birth date:11/20/2023 Birth time:1:23 PM Gender:Female Living status:Living Apgars:6 ,8  Weight:2810 g  Magnesium Sulfate received: Yes: Seizure prophylaxis BMZ received: Yes Rhophylac:N/A MMR:N/A T-DaP:Given prenatally Flu: Yes RSV Vaccine received: Yes Transfusion:No Immunizations administered: Immunization History   Administered Date(s) Administered   DTaP 12/16/1998, 02/15/1999, 12/29/2003, 06/28/2004   HIB (PRP-OMP) 12/16/1998, 02/15/1999   HPV Bivalent 12/08/2011, 02/09/2012, 06/21/2012   Hepatitis B 11/15/1998, 12/16/1998, 02/25/2004   IPV 12/16/1998, 02/15/1999, 12/29/2003   Influenza-Unspecified 08/30/2017, 07/22/2019   MMR 12/29/2003, 02/25/2004   Moderna Sars-Covid-2 Vaccination 11/19/2019, 12/17/2019, 11/08/2020   Rsv, Bivalent, Protein Subunit Rsvpref,pf Verdis Frederickson) 10/24/2023   Tdap 04/12/2010, 09/26/2023    Physical exam  Vitals:   11/22/23 1950 11/22/23 2254 11/23/23 0328 11/23/23 1035  BP: (!) 140/91 (!) 149/107 (!) 133/107 (!) 149/101  Pulse: (!) 105 (!) 116 (!) 113 (!) 117  Resp: 18 20 19 20   Temp: 98.6 F (37 C) 98.1 F (36.7 C) 98.1 F (36.7 C) 98.5 F (36.9 C)  TempSrc: Oral Oral Oral Oral  SpO2: 98% 95% 97% 96%  Weight:      Height:       General: alert, cooperative, and no distress Lochia: appropriate Uterine Fundus: firm Incision: N/A DVT Evaluation: No evidence of DVT seen on physical exam. Labs: Lab Results  Component Value Date   WBC 13.2 (H) 11/23/2023   HGB 12.6 11/23/2023   HCT 37.5 11/23/2023   MCV 88.0 11/23/2023   PLT 245 11/23/2023      Latest Ref Rng & Units 11/23/2023   10:23 AM  CMP  Glucose 70 - 99 mg/dL 409   BUN 6 - 20 mg/dL 9   Creatinine 8.11 - 9.14 mg/dL 7.82   Sodium 956 - 213 mmol/L 136   Potassium 3.5 - 5.1 mmol/L 3.3   Chloride 98 - 111 mmol/L 101   CO2 22 - 32 mmol/L 23  Calcium 8.9 - 10.3 mg/dL 9.1   Total Protein 6.5 - 8.1 g/dL 6.8   Total Bilirubin 0.0 - 1.2 mg/dL 0.7   Alkaline Phos 38 - 126 U/L 162   AST 15 - 41 U/L 103   ALT 0 - 44 U/L 56    Edinburgh Score:    11/21/2023    7:30 AM  Edinburgh Postnatal Depression Scale Screening Tool  I have been able to laugh and see the funny side of things. 0  I have looked forward with enjoyment to things. 0  I have blamed myself unnecessarily when things went wrong. 0  I  have been anxious or worried for no good reason. 2  I have felt scared or panicky for no good reason. 1  Things have been getting on top of me. 0  I have been so unhappy that I have had difficulty sleeping. 1  I have felt sad or miserable. 0  I have been so unhappy that I have been crying. 0  The thought of harming myself has occurred to me. 0  Edinburgh Postnatal Depression Scale Total 4      After visit meds:  Allergies as of 11/23/2023       Reactions   Shrimp Extract Swelling   Other Rash   Shellfish Allergy         Medication List     STOP taking these medications    labetalol 100 MG tablet Commonly known as: NORMODYNE       TAKE these medications    acetaminophen 325 MG tablet Commonly known as: Tylenol Take 2 tablets (650 mg total) by mouth every 4 (four) hours as needed (for pain scale < 4).   benzocaine-Menthol 20-0.5 % Aero Commonly known as: DERMOPLAST Apply 1 Application topically as needed for irritation (perineal discomfort).   docusate sodium 100 MG capsule Commonly known as: COLACE Take 1 capsule (100 mg total) by mouth 2 (two) times daily as needed for mild constipation.   escitalopram 20 MG tablet Commonly known as: LEXAPRO Take 1 tablet (20 mg total) by mouth at bedtime.   folic acid 400 MCG tablet Commonly known as: FOLVITE Take 400 mcg by mouth daily.   ibuprofen 600 MG tablet Commonly known as: ADVIL Take 1 tablet (600 mg total) by mouth every 6 (six) hours.   multivitamin-prenatal 27-0.8 MG Tabs tablet Take 1 tablet by mouth daily at 12 noon.   NIFEdipine 60 MG 24 hr tablet Commonly known as: ADALAT CC Take 1 tablet (60 mg total) by mouth daily.   simethicone 80 MG chewable tablet Commonly known as: MYLICON Chew 1 tablet (80 mg total) by mouth 4 (four) times daily as needed for flatulence.   VITAMIN D-3 PO Take 1,000 Units by mouth at bedtime.         Discharge home in stable condition Infant Feeding: Bottle and  Breast Infant Disposition:NICU Discharge instruction: per After Visit Summary and Postpartum booklet. Activity: Advance as tolerated. Pelvic rest for 6 weeks.  Diet: routine diet Anticipated Birth Control: Unsure Postpartum Appointment:6 weeks Additional Postpartum F/U: Postpartum Depression checkup and BP check 1 week Future Appointments: Future Appointments  Date Time Provider Department Center  12/03/2023  3:55 PM Free, Lindalou Hose, CNM [facility] None  12/31/2023  1:15 PM Free, Lindalou Hose, CNM [facility] None   Follow up Visit:  Follow-up Information     Dominic, Courtney Heys, CNM. Schedule an appointment as soon as possible for a visit.   Specialty: Obstetrics  and Gynecology Why: 1 week BP check  2 week telehealth mood check  6 week in person Contact information: [address] [place] [address] [phone]                     11/23/2023 Dominica Severin, CNM

## 2023-11-20 NOTE — Progress Notes (Addendum)
 FRANCENE MCERLEAN is a 28 y.o. G2P0010 at [redacted]w[redacted]d by LMP admitted for induction of labor due to [facility] superimposed preeclampsia with severe features   Subjective: Resting quietly per RN   Objective: BP (!) 148/91   Pulse 99   Temp 97.6 F (36.4 C) (Oral)   Resp 18   Ht 5' 1 (1.549 m)   Wt 119.7 kg   LMP 03/15/2023   BMI 49.88 kg/m  No intake/output data recorded. Total I/O In: 1368.3 [I.V.:1368.3] Out: 1090 [Urine:1090]  FHT:  FHR: 120 bpm, variability: moderate,  accelerations:  Present,  decelerations:  Absent UC:   irregular  SVE:   Dilation: 4 Effacement (%): 70 Station: -3 Exam by:: A.Rowland,RN  Labs: Lab Results  Component Value Date   WBC 15.9 (H) 11/19/2023   HGB 12.7 11/19/2023   HCT 37.5 11/19/2023   MCV 85.8 11/19/2023   PLT 187 11/19/2023    Assessment / Plan: IOL secondary to   [facility] superimposed preeclampsia with severe features   Labor: cook's out, Cytotec  #2 given at 2351, will start Pitocin  when able  Preeclampsia:  on magnesium  sulfate, no signs or symptoms of toxicity, and intake and ouput balanced Fetal Wellbeing:  Category I Pain Control:   planning epidural  I/D:   GBS pending, membranes intact  Anticipated MOD:  NSVD  [name] Derwin Reddy, CNM 11/20/2023, 2:56 AM

## 2023-11-20 NOTE — Significant Event (Cosign Needed)
 Called to bedside at 1035 for deep variable FHR decels down to around 60 bpm. Multiple maternal position changes attempted with no resolution in variable FHR decels. Pitocin  gtt was paused at this time. Difficulty tracing FHR and uterine contractions with external monitors due to maternal position changes. Discussed placement of IUPC and FSE for more accurate fetal monitoring with Aslynn and her partner who are in agreement with plan. Variable FHR decels continued, so recommendation for amnioinfusion was discussed, and Jenette and partner are in agreement with this plan. FSE placed by Lauraine Range, CNM and IUPC placed by this SNM. Amnioinfusion initiated with placement of IUPC, now with resolution of deep variables. Will continue to monitor closely.   Vernell Maxin, SNM  Lauraine Range, CNM present for all portions of care.

## 2023-11-20 NOTE — Progress Notes (Signed)
Pt laying on cuff

## 2023-11-20 NOTE — Anesthesia Procedure Notes (Signed)
 Epidural Patient location during procedure: OB Start time: 11/20/2023 8:55 AM End time: 11/20/2023 9:25 AM  Staffing Anesthesiologist: Piscitello, Fairy POUR, MD Resident/CRNA: Landy Hone D, CRNA Performed: resident/CRNA   Preanesthetic Checklist Completed: patient identified, IV checked, site marked, risks and benefits discussed, surgical consent, monitors and equipment checked, pre-op evaluation and timeout performed  Epidural Patient position: sitting Prep: ChloraPrep Patient monitoring: heart rate, continuous pulse ox and blood pressure Approach: midline Location: L3-L4 Injection technique: LOR saline  Needle:  Needle type: Tuohy  Needle gauge: 17 G Needle length: 9 cm and 9 Needle insertion depth: 7 cm Catheter type: closed end flexible Catheter size: 19 Gauge Catheter at skin depth: 13 cm Test dose: negative and 1.5% lidocaine  with Epi 1:200 K  Assessment Sensory level: T10 Events: blood not aspirated, no cerebrospinal fluid, injection not painful, no injection resistance, no paresthesia and negative IV test  Additional Notes 1 attempt Pt. Evaluated and documentation done after procedure finished. Patient identified. Risks/Benefits/Options discussed with patient including but not limited to bleeding, infection, nerve damage, paralysis, failed block, incomplete pain control, headache, blood pressure changes, nausea, vomiting, reactions to medication both or allergic, itching and postpartum back pain. Confirmed with bedside nurse the patient's most recent platelet count. Confirmed with patient that they are not currently taking any anticoagulation, have any bleeding history or any family history of bleeding disorders. Patient expressed understanding and wished to proceed. All questions were answered. Sterile technique was used throughout the entire procedure. Please see nursing notes for vital signs. Test dose was given through epidural catheter and negative prior to  continuing to dose epidural or start infusion. Warning signs of high block given to the patient including shortness of breath, tingling/numbness in hands, complete motor block, or any concerning symptoms with instructions to call for help. Patient was given instructions on fall risk and not to get out of bed. All questions and concerns addressed with instructions to call with any issues or inadequate analgesia.    Patient tolerated the insertion well without immediate complications.Reason for block:procedure for pain

## 2023-11-20 NOTE — Anesthesia Preprocedure Evaluation (Signed)
 Anesthesia Evaluation  Patient identified by MRN, date of birth, ID band Patient awake    Reviewed: Allergy & Precautions, H&P , NPO status , Patient's Chart, lab work & pertinent test results  Airway Mallampati: II  TM Distance: >3 FB     Dental no notable dental hx.    Pulmonary neg pulmonary ROS   Pulmonary exam normal breath sounds clear to auscultation       Cardiovascular hypertension, On Medications Normal cardiovascular exam Rhythm:regular Rate:Normal  Gestational HTN, Pre-eclampsia   Neuro/Psych  PSYCHIATRIC DISORDERS Anxiety Depression    negative neurological ROS     GI/Hepatic Neg liver ROS,GERD  ,,  Endo/Other  negative endocrine ROS    Renal/GU negative Renal ROS  negative genitourinary   Musculoskeletal   Abdominal   Peds  Hematology negative hematology ROS (+)   Anesthesia Other Findings   Reproductive/Obstetrics (+) Pregnancy                             Anesthesia Physical Anesthesia Plan  ASA: 3  Anesthesia Plan: Epidural   Post-op Pain Management:    Induction:   PONV Risk Score and Plan:   Airway Management Planned:   Additional Equipment:   Intra-op Plan:   Post-operative Plan:   Informed Consent: I have reviewed the patients History and Physical, chart, labs and discussed the procedure including the risks, benefits and alternatives for the proposed anesthesia with the patient or authorized representative who has indicated his/her understanding and acceptance.     Dental Advisory Given  Plan Discussed with: CRNA and Anesthesiologist  Anesthesia Plan Comments:        Anesthesia Quick Evaluation

## 2023-11-20 NOTE — Progress Notes (Signed)
 Paige Williamson is a 28 y.o. G2P0010 at [redacted]w[redacted]d by LMP admitted for induction of labor due to Superimposed Pre-Eclampsia with SF.  Subjective: Resting comfortably with epidural.   Objective: BP 132/74   Pulse 99   Temp 97.7 F (36.5 C) (Oral)   Resp 18   Ht 5' 1 (1.549 m)   Wt 119.7 kg   LMP 03/15/2023   SpO2 (!) 85% Comment: CNM notified  BMI 49.88 kg/m  I/O last 3 completed shifts: In: 1985 [I.V.:1985] Out: 2490 [Urine:2490] Total I/O In: 637.7 [P.O.:75; I.V.:562.7] Out: 300 [Urine:300]  FHT:  FHR: 125 bpm, variability: moderate,  accelerations:  Present,  decelerations:  Absent UC:   regular, every 2-3 minutes SVE:   Dilation: 5 Effacement (%): 70 Station: -2 Exam by:: [employer]  Labs: Lab Results  Component Value Date   WBC 15.9 (H) 11/19/2023   HGB 12.7 11/19/2023   HCT 37.5 11/19/2023   MCV 85.8 11/19/2023   PLT 187 11/19/2023  CMP     Component Value Date/Time   NA 134 (L) 11/19/2023 2128   NA 136 11/15/2023 1625   K 3.8 11/19/2023 2128   CL 101 11/19/2023 2128   CO2 22 11/19/2023 2128   GLUCOSE 113 (H) 11/19/2023 2128   BUN 8 11/19/2023 2128   BUN 9 11/15/2023 1625   CREATININE 0.65 11/19/2023 2128   CALCIUM  9.3 11/19/2023 2128   PROT 6.5 11/19/2023 2128   PROT 6.3 11/15/2023 1625   ALBUMIN 2.9 (L) 11/19/2023 2128   ALBUMIN 3.6 (L) 11/15/2023 1625   AST 76 (H) 11/19/2023 2128   ALT 50 (H) 11/19/2023 2128   ALKPHOS 186 (H) 11/19/2023 2128   BILITOT 0.5 11/19/2023 2128   BILITOT <0.2 11/15/2023 1625   EGFR 127 11/15/2023 1625   GFRNONAA >60 11/19/2023 2128    Lab Results  Component Value Date   PROTCRRATIO 0.82 (H) 11/19/2023    Assessment / Plan: Induction of labor at 35 weeks 5 days for Superimposed Pre-Eclampsia with SF.  Vital signs reviewed and stable.  Remains on continuous magnesium  sulfate infusion. Denies HA, visual changes, RUQ pain.  Continues on antibiotics for GBS prophylaxis.  Cervical exam 5/70/-2. AROM for clear fluid.  Will continue titration of pitocin  per protocol.  Labor epidural in place; resting comfortably in bed.   Labor: Progressing normally Preeclampsia:  on magnesium  sulfate, no signs or symptoms of toxicity, intake and ouput balanced, and labs stable Fetal Wellbeing:  Category I Pain Control:  Epidural Anticipated MOD:  NSVD  Vernell VEAR Maxin, Student-MidWife 11/20/2023, 10:28 AM  Lauraine Range, CNM present for all portions of care.

## 2023-11-21 MED ORDER — ESCITALOPRAM OXALATE 10 MG PO TABS
20.0000 mg | ORAL_TABLET | Freq: Every day | ORAL | Status: DC
Start: 2023-11-22 — End: 2023-11-23
  Administered 2023-11-22 – 2023-11-23 (×2): 20 mg via ORAL
  Filled 2023-11-21 (×2): qty 2

## 2023-11-21 MED ORDER — LABETALOL HCL 100 MG PO TABS
ORAL_TABLET | ORAL | Status: AC
  Filled 2023-11-21: qty 2

## 2023-11-21 NOTE — Anesthesia Postprocedure Evaluation (Signed)
 Anesthesia Post Note  Patient: Paige Williamson  Procedure(s) Performed: AN AD HOC LABOR EPIDURAL  Patient location during evaluation: Mother Baby Anesthesia Type: Epidural Level of consciousness: awake and alert Pain management: pain level controlled Vital Signs Assessment: post-procedure vital signs reviewed and stable Respiratory status: spontaneous breathing, nonlabored ventilation and respiratory function stable Cardiovascular status: stable Postop Assessment: no headache, no backache, epidural receding and able to ambulate Anesthetic complications: no   No notable events documented.   Last Vitals:  Vitals:   11/21/23 0430 11/21/23 0630  BP: 139/81 131/76  Pulse: 93 90  Resp: 18 18  Temp: 36.7 C   SpO2: 96%     Last Pain:  Vitals:   11/21/23 0527  TempSrc:   PainSc: 2                  Nasario Badder

## 2023-11-21 NOTE — Progress Notes (Signed)
 Pt in SCN to visit baby

## 2023-11-21 NOTE — Progress Notes (Signed)
 Subjective:   Paige Williamson had a NSVB on 11/20/23 at 1325. Was induced for [facility] with superimposed preeclampsia with severe pressures. Remained on Mag for 24 hours postpartum, the Mag was recently turned off. Infant to NICU for prematurity. Last needed the rescue labetalol  dose yesterday at [phone]. Currently reports feeling good. Denies visual changes, RUQ pain or HA.  Pt. Is eating, hydrating, and voiding regularly without difficulty. Has yet to have BM. Baby has been able to nurse some at the breast, Paige Williamson has also been set up with a pump and encouraged to pump every three hours. Reports scant vaginal bleeding, passed one golfball sized clot this morning and has had a few quarter sized clots. Has had cramping abdomen pain relieved with tylenol /ibuprofen .  Objective:  Vital signs in last 24 hours: Temp:  [98 F (36.7 C)-99 F (37.2 C)] 98.6 F (37 C) (01/15 0737) Pulse Rate:  [84-116] 100 (01/15 1451) Resp:  [16-20] 20 (01/15 0737) BP: (94-157)/(46-95) 153/86 (01/15 1451) SpO2:  [93 %-98 %] 96 % (01/15 0430)    General: NAD Pulmonary: no increased work of breathing Breasts: soft, non-tender, nipples without breakdown Abdomen: soft, non-tender Fundus: firm, midline, at umbilicus Lochia: light rubra, no clots Perineum: no erythema or foul odor discharge, minimal edema, laceration well approximated  Extremities: no edema, no erythema, no tenderness  Results for orders placed or performed during the hospital encounter of 11/19/23 (from the past 72 hours)  Protein / creatinine ratio, urine     Status: Abnormal   Collection Time: 11/19/23  5:00 PM  Result Value Ref Range   Creatinine, Urine 72 mg/dL   Total Protein, Urine 59 mg/dL    Comment: NO NORMAL RANGE ESTABLISHED FOR THIS TEST   Protein Creatinine Ratio 0.82 (H) 0.00 - 0.15 mg/mg[Cre]    Comment: Performed at [facility], [address] [place], [place] [address]  CBC     Status: Abnormal   Collection Time: 11/19/23   6:18 PM  Result Value Ref Range   WBC 14.0 (H) 4.0 - 10.5 K/uL   RBC 4.51 3.87 - 5.11 MIL/uL   Hemoglobin 13.2 12.0 - 15.0 g/dL   HCT 40.9 81.1 - 91.4 %   MCV 84.9 80.0 - 100.0 fL   MCH 29.3 26.0 - 34.0 pg   MCHC 34.5 30.0 - 36.0 g/dL   RDW 78.2 95.6 - 21.3 %   Platelets 192 150 - 400 K/uL   nRBC 0.0 0.0 - 0.2 %    Comment: Performed at [facility] [address] [place], [place] [address]  Comprehensive metabolic panel     Status: Abnormal   Collection Time: 11/19/23  6:18 PM  Result Value Ref Range   Sodium 132 (L) 135 - 145 mmol/L   Potassium 3.9 3.5 - 5.1 mmol/L   Chloride 100 98 - 111 mmol/L   CO2 21 (L) 22 - 32 mmol/L   Glucose, Bld 81 70 - 99 mg/dL    Comment: Glucose reference range applies only to samples taken after fasting for at least 8 hours.   BUN 9 6 - 20 mg/dL   Creatinine, Ser 8.46 0.44 - 1.00 mg/dL   Calcium  9.3 8.9 - 10.3 mg/dL   Total Protein 6.8 6.5 - 8.1 g/dL   Albumin 2.9 (L) 3.5 - 5.0 g/dL   AST 69 (H) 15 - 41 U/L   ALT 48 (H) 0 - 44 U/L   Alkaline Phosphatase 205 (H) 38 - 126 U/L  Total Bilirubin 0.5 0.0 - 1.2 mg/dL   GFR, Estimated >16 >10 mL/min    Comment: (NOTE) Calculated using the CKD-EPI Creatinine Equation (2021)    Anion gap 11 5 - 15    Comment: Performed at [facility], [address] [place], [place] [address]  Culture, beta strep (group b only)     Status: None (Preliminary result)   Collection Time: 11/19/23  7:35 PM   Specimen: Vaginal/Rectal; Genital  Result Value Ref Range   Specimen Description      VAGINAL/RECTAL Performed at [facility], [address] [place], [place] [address]    Special Requests      NONE Performed at [facility], [address], [place], [place] [address]    Culture      CULTURE REINCUBATED FOR BETTER GROWTH Performed at [facility], [address] [place], [place] [address]    Report Status PENDING   CBC     Status: Abnormal   Collection  Time: 11/19/23  9:28 PM  Result Value Ref Range   WBC 15.9 (H) 4.0 - 10.5 K/uL   RBC 4.37 3.87 - 5.11 MIL/uL   Hemoglobin 12.7 12.0 - 15.0 g/dL   HCT 21.3 08.6 - 57.8 %   MCV 85.8 80.0 - 100.0 fL   MCH 29.1 26.0 - 34.0 pg   MCHC 33.9 30.0 - 36.0 g/dL   RDW 46.9 62.9 - 52.8 %   Platelets 187 150 - 400 K/uL   nRBC 0.0 0.0 - 0.2 %    Comment: Performed at [facility], [address] [place], [place] [address]  Type and screen     Status: None   Collection Time: 11/19/23  9:28 PM  Result Value Ref Range   ABO/RH(D) O POS    Antibody Screen NEG    Sample Expiration      11/22/2023,2359 Performed at [facility], [address] [place], [place] [address]   RPR     Status: None   Collection Time: 11/19/23  9:28 PM  Result Value Ref Range   RPR Ser Ql NON REACTIVE NON REACTIVE    Comment: Performed at [facility], [address], [place], [place] [address]  Comprehensive metabolic panel     Status: Abnormal   Collection Time: 11/19/23  9:28 PM  Result Value Ref Range   Sodium 134 (L) 135 - 145 mmol/L   Potassium 3.8 3.5 - 5.1 mmol/L   Chloride 101 98 - 111 mmol/L   CO2 22 22 - 32 mmol/L   Glucose, Bld 113 (H) 70 - 99 mg/dL    Comment: Glucose reference range applies only to samples taken after fasting for at least 8 hours.   BUN 8 6 - 20 mg/dL   Creatinine, Ser 6.44 0.44 - 1.00 mg/dL   Calcium  9.3 8.9 - 10.3 mg/dL   Total Protein 6.5 6.5 - 8.1 g/dL   Albumin 2.9 (L) 3.5 - 5.0 g/dL   AST 76 (H) 15 - 41 U/L   ALT 50 (H) 0 - 44 U/L   Alkaline Phosphatase 186 (H) 38 - 126 U/L   Total Bilirubin 0.5 0.0 - 1.2 mg/dL   GFR, Estimated >03 >47 mL/min    Comment: (NOTE) Calculated using the CKD-EPI Creatinine Equation (2021)    Anion gap 11 5 - 15    Comment: Performed at [facility], [address] [place], [place] [address]    Assessment:  28 y.o. [id number] 1 day(s)  s/p NSVB Breastfeeding/pumping Baby in NICU Severe preeclampsia- now  off of Mag with mild range Bps  Mild range blood pressures- other VSS Pain well controlled  Plan:    Blood Type --/--/O POS (01/13 2128) / Rubella 3.94 (08/08 1540) / Varicella Immune Rhogam not indicated Tdap/varicella/rubella not indicated Feeding plan breast, lactation support Encouraged to continue breastfeeding, BF education on latch, position changes, cluster feeding, hunger cues, lactogenesis II, milk supply, pumping to protect supply  Continued routine postpartum care  Counseled on normal uterine involution and vaginal bleeding postpartum Anticipate discharge home tomorrow    Lou Rounds, FNP, CNM [facility] 11/21/2023, 3:08 PM

## 2023-11-21 NOTE — Lactation Note (Signed)
 This note was copied from a baby's chart. Lactation Consultation Note  Patient Name: Girl Ethan Cederquist [id number] Date: 11/21/2023 Age:26 hours Reason for consult: Follow-up assessment;Primapara;Late-preterm 34-36.6wks   Maternal Data Lactation to SCN to observe a feeding w/ P1 patient and 25hr old baby girl "Annie".  Infant just completed a feeding at the breast.  RN and patient stated that she latched and actively nursed immediately.   Patient verbalized to [facility] that she has Spectra  pump at home and a Mom Cozy.   Feeding Mother's Current Feeding Choice: Breast Milk and Donor Milk  Interventions Interventions: Skin to skin;Education  LC discussed pumping with patient.  She expressed that she has been pumping and getting about 5-76ml each session.  LC also encouraged patient that when she does go home to use her Spectra  pump for now until milk supply is well established.   Discharge Pump: Personal;Hands Danashia Landers;DEBP (Spectra  and Mom Cozy)  Consult Status Consult Status: Follow-up Follow-up type: In-patient    Quandarius Nill S Afrika Brick 11/21/2023, 3:13 PM

## 2023-11-22 ENCOUNTER — Other Ambulatory Visit: Payer: BC Managed Care – PPO

## 2023-11-22 ENCOUNTER — Encounter: Payer: BC Managed Care – PPO | Admitting: Obstetrics

## 2023-11-22 LAB — CULTURE, BETA STREP (GROUP B ONLY)

## 2023-11-22 MED ORDER — NIFEDIPINE ER OSMOTIC RELEASE 30 MG PO TB24
60.0000 mg | ORAL_TABLET | Freq: Every day | ORAL | Status: DC
  Administered 2023-11-22 – 2023-11-23 (×2): 60 mg via ORAL
  Filled 2023-11-22 (×2): qty 2

## 2023-11-22 MED ORDER — NIFEDIPINE ER OSMOTIC RELEASE 30 MG PO TB24: 30.0000 mg | ORAL_TABLET | Freq: Every day | ORAL | Status: DC

## 2023-11-22 MED ORDER — NIFEDIPINE ER OSMOTIC RELEASE 30 MG PO TB24: 60.0000 mg | ORAL_TABLET | Freq: Every day | ORAL | Status: DC

## 2023-11-22 MED ORDER — LABETALOL HCL 200 MG PO TABS
200.0000 mg | ORAL_TABLET | Freq: Once | ORAL | Status: AC
  Administered 2023-11-22: 200 mg via ORAL
  Filled 2023-11-22: qty 1

## 2023-11-22 NOTE — Progress Notes (Addendum)
Progress Note - Vaginal Delivery  Paige Williamson is a 28 y.o. [phone] now PP day 3 s/p status post Vaginal, Spontaneous with pre eclampsia and magnesium sulfate therapy.   Subjective:  The patient reports no complaints, up ad lib, voiding, and tolerating PO She denies epigastric pain , visual disturbance, and headaches.   Objective:  Vital signs in last 24 hours: Temp:  [97.6 F (36.4 C)-99 F (37.2 C)] 98.6 F (37 C) (01/16 0755) Pulse Rate:  [85-100] 86 (01/16 0919) Resp:  [18-20] 18 (01/16 0755) BP: (126-162)/(67-102) 145/91 (01/16 0919) SpO2:  [97 %-100 %] 97 % (01/16 0755)  Physical Exam:  General: alert, cooperative, appears stated age, and no distress Lochia: appropriate Uterine Fundus: firm @u     Data Review Recent Labs    11/19/23 1818 11/19/23 2128  HGB 13.2 12.7  HCT 38.3 37.5    Assessment/Plan: Principal Problem:   Chronic hypertension in pregnancy Active Problems:   Anxiety and depression     Overview: Currently 10 mg escitalopram, being seen regularly by psychiatrist.           Last Assessment & Plan: Formatting of this note might be different from      the original. Some improvement on low dose prozac, will increase to 20 mg      prozac and monitor closely for benefit. Reiterated importance of finding      counseling to help as adjunct. Recheck in 1 month   Encounter for supervision of normal pregnancy     Overview: Formatting of this note might be different from the original.     28 y.o. G1P0000 at 02/26/22 Not consistent with   ultrasound @ [redacted]w[redacted]d      .Estimated Date of Delivery: 12/13/2021.     Sex of baby and name:  " "   Partner:    Greig Castilla     Factors complicating this pregnancy      Obesity     Start 81mg  aspirin at 12wks until 35-37wks     Needs early 1hr GTT, CMP, and P/C ratio     Anxiety and Depression     Taking Abilify and Lexapro     NOB EPDS 2     Enlarged yolk sac at 9wks     Yolk sac measuring 1.30cm, supposed to be <37mm      Repeat US in 2wks     Do NOB labs after repeat US based on fetal viability     Screening results and needs:     NOB: 9wks     Medicaid Questionnaire: N/A     []  ACHD Program     Depression Score:     MBT:   Ab screen:   G1p     HIV:   RPR:        Hep B: Hep C:      Pap: 2022 nilm G/C:      Rubella:    VZV:      TSH: HgA1C     Aneuploidy:      First trimester:      MaternitT21:        Second trimester (AFP/tetra):      28 weeks:      Review Medicaid Questionnaire:     []  ACHD Program     Depression Score:     Blood consent:     Hgb:   Platelets:    Glucola:   Rhogam:      36 weeks:  GBS:   G/C:   Hgb:  Platelets:    HIV: RPR:        Last [place]:      05/10/22 Single iup seen with yolk sac and fetal pole Crl=2.22 cm= 9.0 wks      Large yolk sac seen= 1.30 cm Fhr=174 bpm Neither ov seen- Repeat in 2      weeks     Immunization:       Flu in season -      Tdap at 27-36 weeks -      Covid-19 -      Contraception Plan:      Feeding Plan:      Labor Plans:   PCOS (polycystic ovarian syndrome)   Chronic hypertension affecting pregnancy     Overview: Increased to 100 mg labetalol BID at NOB     -Growth [place] q4 wks beginning at 28 wks     - Weekly BPPs starting 12/18, NST week of 12/25     - Baseline PIH labs with NOB          11/18: EFW 76%, MVP 6.16   Morbid obesity with BMI of 45.0-49.9, adult (HCC)     Overview: First/Second Trimester BMI >40     [ ]  1mg  Folic acid     [ ]  Baseline PIH labs, thyroid, HgbA1c     [ ]  Early 1 hour glucola- WNL 80     [ ]  Anesthesia consult BMI > 50     [ ]  Daily 81 mg ASA (consider 162 mg) at 12 weeks     [ ]  Nutrition consult, discuss weight goals < 15 lbs TWG     [ ]  Detailed anatomy US          Third Trimester BMI >40     [ ]  Repeat one hour glucola     [ ]  Growth [place] q4 weeks starting at 28 weeks     [ ]  Anesthesia consult with BMI > 45     [ ]  Weekly NSTs beginning at 34 weeks     [ ]  Consider induction by 40 weeks    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 Discussed with Dr. Lonny Prude. Will switch for labetalol to procardia xL 60 mg daily. Orders placed. Pt to stay tonight for continued blood pressure monitoring. Pt is in agreement to plan.   -- Continue routine PP care.     Doreene Burke, CNM  11/22/2023 11:00 AM

## 2023-11-22 NOTE — Lactation Note (Signed)
This note was copied from a baby's chart. Lactation Consultation Note  Patient Name: Paige Williamson [id number] Date: 11/22/2023 Age:26 hours Reason for consult: Follow-up assessment;Primapara;NICU baby;Late-preterm 34-36.6wks;Breastfeeding assistance   Maternal Data Lactation to SCN to assist patient w/ a breastfeeding session.  Infant is currently on a bili blanket.   During the visit with patient at bedside, patient was concerned that her breast are pumping 2 different amts of milk.  Last pump session patient was able to get 20ml of EBM.    Feeding Mother's Current Feeding Choice: Breast Milk and Donor Milk  Lactation removed infant from her bed and placed infant at the breast of patient.  Patient was able to independently latch infant with no issues.  Infant nursed for 6 minutes at the breast before slowing down.  Infant was then placed STS.   LATCH Score Latch: Grasps breast easily, tongue down, lips flanged, rhythmical sucking.  Audible Swallowing: A few with stimulation  Type of Nipple: Everted at rest and after stimulation  Comfort (Breast/Nipple): Soft / non-tender  Hold (Positioning): Assistance needed to correctly position infant at breast and maintain latch.  LATCH Score: 8  Lactation Tools Discussed/Used Tools: Flanges Flange Size: 21  Interventions Interventions: Skin to skin;Adjust position;Education  LC reviewed the importance of pumping with patient.  Patient stated that she is pumping at least 8-12x w/in a 24hr period.  During this time LC measured patients nipples.   Consult Status Consult Status: Follow-up Follow-up type: In-patient    Paige Williamson 11/22/2023, 12:48 PM

## 2023-11-23 ENCOUNTER — Other Ambulatory Visit: Payer: Self-pay

## 2023-11-23 ENCOUNTER — Inpatient Hospital Stay: Admission: RE | Admit: 2023-11-23 | Payer: BC Managed Care – PPO | Source: Ambulatory Visit

## 2023-11-23 LAB — COMPREHENSIVE METABOLIC PANEL
ALT: 56 U/L — ABNORMAL HIGH (ref 0–44)
AST: 103 U/L — ABNORMAL HIGH (ref 15–41)
Albumin: 3.3 g/dL — ABNORMAL LOW (ref 3.5–5.0)
Alkaline Phosphatase: 162 U/L — ABNORMAL HIGH (ref 38–126)
Anion gap: 12 (ref 5–15)
BUN: 9 mg/dL (ref 6–20)
CO2: 23 mmol/L (ref 22–32)
Calcium: 9.1 mg/dL (ref 8.9–10.3)
Chloride: 101 mmol/L (ref 98–111)
Creatinine, Ser: 0.71 mg/dL (ref 0.44–1.00)
GFR, Estimated: >60 mL/min (ref >60)
Glucose, Bld: 110 mg/dL — ABNORMAL HIGH (ref 70–99)
Potassium: 3.3 mmol/L — ABNORMAL LOW (ref 3.5–5.1)
Sodium: 136 mmol/L (ref 135–145)
Total Bilirubin: 0.7 mg/dL (ref 0.0–1.2)
Total Protein: 6.8 g/dL (ref 6.5–8.1)

## 2023-11-23 LAB — CBC
HCT: 37.5 % (ref 36.0–46.0)
Hemoglobin: 12.6 g/dL (ref 12.0–15.0)
MCH: 29.6 pg (ref 26.0–34.0)
MCHC: 33.6 g/dL (ref 30.0–36.0)
MCV: 88 fL (ref 80.0–100.0)
Platelets: 245 10*3/uL (ref 150–400)
RBC: 4.26 MIL/uL (ref 3.87–5.11)
RDW: 14.7 % (ref 11.5–15.5)
WBC: 13.2 10*3/uL — ABNORMAL HIGH (ref 4.0–10.5)
nRBC: 0 % (ref 0.0–0.2)

## 2023-11-23 LAB — SURGICAL PATHOLOGY

## 2023-11-23 MED ORDER — DOCUSATE SODIUM 100 MG PO CAPS
100.0000 mg | ORAL_CAPSULE | Freq: Two times a day (BID) | ORAL | 0 refills | Status: AC | PRN
  Filled 2023-11-23: qty 100, 50d supply, fill #0

## 2023-11-23 MED ORDER — ACETAMINOPHEN 325 MG PO TABS
650.0000 mg | ORAL_TABLET | ORAL | 0 refills | Status: AC | PRN
  Filled 2023-11-23: qty 100, 9d supply, fill #0

## 2023-11-23 MED ORDER — IBUPROFEN 600 MG PO TABS
600.0000 mg | ORAL_TABLET | Freq: Four times a day (QID) | ORAL | 1 refills | Status: AC
  Filled 2023-11-23: qty 60, 15d supply, fill #0

## 2023-11-23 MED ORDER — BENZOCAINE-MENTHOL 20-0.5 % EX AERO
1.0000 | INHALATION_SPRAY | CUTANEOUS | 0 refills | Status: AC | PRN
  Filled 2023-11-23: qty 78, 14d supply, fill #0

## 2023-11-23 MED ORDER — NIFEDIPINE ER 60 MG PO TB24: 60.0000 mg | ORAL_TABLET | Freq: Every day | ORAL | 1 refills | Status: DC

## 2023-11-23 MED ORDER — ESCITALOPRAM OXALATE 20 MG PO TABS
20.0000 mg | ORAL_TABLET | Freq: Every day | ORAL | 0 refills | Status: AC
  Filled 2023-11-23: qty 30, 30d supply, fill #0

## 2023-11-23 MED ORDER — ACETAMINOPHEN 325 MG PO TABS: 650.0000 mg | ORAL_TABLET | ORAL | Status: DC | PRN

## 2023-11-23 MED ORDER — SIMETHICONE 80 MG PO CHEW
80.0000 mg | CHEWABLE_TABLET | Freq: Four times a day (QID) | ORAL | 0 refills | Status: AC | PRN
  Filled 2023-11-23: qty 100, 25d supply, fill #0

## 2023-11-23 MED ORDER — NIFEDIPINE ER 60 MG PO TB24
60.0000 mg | ORAL_TABLET | Freq: Every day | ORAL | 1 refills | Status: AC
  Filled 2023-11-23 (×2): qty 30, 30d supply, fill #0

## 2023-11-23 MED ORDER — DOCUSATE SODIUM 100 MG PO CAPS: 100.0000 mg | ORAL_CAPSULE | Freq: Two times a day (BID) | ORAL | Status: DC | PRN

## 2023-11-23 MED ORDER — SIMETHICONE 80 MG PO CHEW: 80.0000 mg | CHEWABLE_TABLET | Freq: Four times a day (QID) | ORAL | Status: DC | PRN

## 2023-11-23 MED ORDER — IBUPROFEN 600 MG PO TABS: 600.0000 mg | ORAL_TABLET | Freq: Four times a day (QID) | ORAL | 1 refills | Status: DC

## 2023-11-23 MED ORDER — BENZOCAINE-MENTHOL 20-0.5 % EX AERO: 1.0000 | INHALATION_SPRAY | CUTANEOUS | Status: DC | PRN

## 2023-11-23 NOTE — Lactation Note (Signed)
This note was copied from a baby's chart. Lactation Consultation Note  Patient Name: Paige Williamson [id number] Date: 11/23/2023 Age:26 hours Reason for consult: Follow-up assessment;Primapara;Late-preterm 34-36.6wks   Maternal Data This is mom's 1st baby, SVD. Mom with history of cHTN and preclampsia with severe features. Baby born at 29 5/7 weeks admitted to [facility] for RDS.  On follow-up today mom reports her milk volumes have increased and she is filling a full bottle with each pump session.LC assisted mom with use of DEBP in pattern once milk volumes have increased. Mom no longer needs to use the initiation phase as she is producing >than 20 ml's from each breast.  Has patient been taught Hand Expression?: Yes Does the patient have breastfeeding experience prior to this delivery?: No  Feeding Mother's Current Feeding Choice: Breast Milk Nipple Type: Dr. Levert Feinstein Preemie    Lactation Tools Discussed/Used Tools: Pump Flange Size: 21 Breast pump type: Double-Electric Breast Pump Pump Education: Setup, frequency, and cleaning;Milk Storage Reason for Pumping: LPT infant Pumping frequency: goal 8 times in 24 hours. Pumped volume: 80 mL (Per mom she is filling up a whole bottle.)  Interventions Interventions: Education;DEBP;LPT handout/interventions (Reviewed use of DEBP now that mom's milk volume has increased.) Provided mom tips and strategies to maximize milk production with use of DEBP. Discharge Pump: Personal;Hands Free;DEBP  Consult Status Consult Status: Follow-up Date: 11/24/23 Follow-up type: In-patient  Update provided to care nurse.  Fuller Song 11/23/2023, 2:48 PM

## 2023-11-23 NOTE — Progress Notes (Signed)
Discharge instructions provided and reviewed with patient. All questions answered at this time. Patient received prescriptions via Meds to Bed. Patient will be rooming-in with infant overnight. No concerns at this time.

## 2023-11-24 ENCOUNTER — Ambulatory Visit: Payer: Self-pay

## 2023-11-24 NOTE — Lactation Note (Signed)
This note was copied from a baby's chart. Lactation Consultation Note  Patient Name: Girl Indira Wesby [id number] Date: 11/24/2023 Age:26 days Reason for consult: Follow-up assessment;Primapara;Late-preterm 34-36.6wks   Maternal Data    Feeding Mother's Current Feeding Choice: Breast Milk  LATCH Score Latch: Grasps breast easily, tongue down, lips flanged, rhythmical sucking.  Audible Swallowing: Spontaneous and intermittent  Type of Nipple: Everted at rest and after stimulation  Comfort (Breast/Nipple): Soft / non-tender  Hold (Positioning): Assistance needed to correctly position infant at breast and maintain latch.  LATCH Score: 9   Lactation Tools Discussed/Used Breast pump type: Double-Electric Breast Pump Pumped volume: 90 mL  Interventions Interventions: Assisted with latch;Skin to skin;Breast compression;Support pillows;DEBP;Education  Discharge Pump: DEBP;Personal  Consult Status Consult Status: PRN Date: 11/24/23 Follow-up type: In-patient    Dyann Kief 11/24/2023, 2:52 PM

## 2023-11-26 ENCOUNTER — Other Ambulatory Visit: Payer: BC Managed Care – PPO

## 2023-12-03 ENCOUNTER — Telehealth: Payer: BC Managed Care – PPO

## 2023-12-03 ENCOUNTER — Other Ambulatory Visit: Payer: BC Managed Care – PPO

## 2023-12-03 NOTE — Progress Notes (Signed)
   Virtual Visit via Video Note   I connected withNAME@  on 12/03/23 4:03 PM by a video enabled telemedicine application and verified that I am speaking with the correct person using two identifiers.   Location: Patient: home Provider: [facility]   I discussed the limitations of evaluation and management by telemedicine and the availability of in person appointments. The patient expressed understanding and agreed to proceed. Subjective:    Subjective  Sritha B Lanahan is a 28 y.o. female who presents for a postpartum visit. She is2 weeks  postpartum following a spontaneous vaginal delivery. I have fully reviewed the prenatal and intrapartum course. The delivery was at [redacted]w[redacted]d gestational age.  Anesthesia: epidural.    Postpartum course has been normal. She has no pain and no other concerns. Baby's course has been normal. Baby is feeding breastmilk by both breast and bottle. Bleeding  light . Bowel function is normal. Bladder function is normal.  .  Contraception method: OCP (estrogen/progesterone). Postpartum depression screening: EPDS 2     Review of Systems Pertinent positives noted in HPI. Remainder of comprehensive ROS otherwise negative.     Objective:      Objective  There were no vitals taken for this visit.  General:  alert, cooperative, and no distress        Assessment:    1. Encounter for screening for maternal depression - Screening Negative today. Will rescreen at 6 week postpartum visit.     2. Postpartum state - Overall doing well. Continue routine postpartum home care.    3. Lactating mother - Baby gaining weight appropriately, has regained birth weight. Adequate supply.  4. Contraception - Desires COCs at 6 week appointment   Plan:      Plan - Follow up in 4 weeks for 6 week postpartum appointment.   I discussed the assessment and treatment plan with the patient. The patient was provided an opportunity to ask questions and all were answered. The  patient agreed with the plan and demonstrated an understanding of the instructions.   The patient was advised to call back or seek an in-person evaluation if the symptoms worsen or if the condition fails to improve as anticipated.   I provided 10 minutes of non-face-to-face time during this encounter.     Lindalou Hose Seann Genther, CNM

## 2023-12-20 ENCOUNTER — Inpatient Hospital Stay: Admit: 2023-12-20 | Payer: BC Managed Care – PPO

## 2023-12-31 ENCOUNTER — Ambulatory Visit: Payer: BC Managed Care – PPO

## 2023-12-31 MED ORDER — SLYND 4 MG PO TABS: 1.0000 | ORAL_TABLET | Freq: Every day | ORAL | 11 refills | Status: AC

## 2023-12-31 NOTE — Progress Notes (Signed)
 Comprehensive Postpartum Visit Note  Subjective:   Paige Williamson is an 28 y.o. who presents today for routine postpartum visit. She is 6 weeks postpartum following a spontaneous vaginal delivery. I have fully reviewed the prenatal and intrapartum course. The delivery was at 6 gestational weeks.  Anesthesia: epidural. Postpartum course has been uncomplicated.  Birth: Vaginal, Spontaneous, feels good about the birth experience. Laceration: 1st degree Bleeding:  light, intermittent .  Bowel function is normal. Bladder function is normal.  Patient is not sexually active. Contraception method is  POPs .  Postpartum depression screening: EPDS 2 Infant Feeding: Breast  Review of Systems Constitutional: Sleeping interrupted at night due to infant care. Appetite is good. No malaise, no fever, no headaches  EPDS Score:   Breasts: no nipple breakdown, no pain, lumps or swelling. Milk supply is sufficient.  Chest: no chest pain, no shortness of breath  Abdomen: no abdominal pain, no nausea or vomiting, no constipation, no incontinence of flatus or stool.   GU: lochia stopped. no discharge or vaginal irritation, no incontinence of urine, no pain with urination. Has not yet resumed intercourse.     Objective:   BP 124/85   Pulse 88   Wt 236 lb 1.6 oz (107.1 kg)   LMP 03/15/2023   Breastfeeding Yes   BMI 44.61 kg/m    OB History  Gravida Para Term Preterm AB Living  2 1  1 1 1   SAB IAB Ectopic Multiple Live Births  1   0 1    # Outcome Date GA Lbr Len/2nd Weight Sex Type Anes PTL Lv  2 Preterm 11/20/23 [redacted]w[redacted]d 12:13 / 00:10 6 lb 3.1 oz (2.81 kg) F Vag-Spont EPI  LIV  1 SAB 05/2022     SAB        Birth Comments: D&C done    Past Medical History:  Diagnosis Date   Anxiety    GERD (gastroesophageal reflux disease)    OCD (obsessive compulsive disorder)    PCOS (polycystic ovarian syndrome) 01/2023    Past Surgical History:  Procedure Laterality Date   DILATION AND  EVACUATION N/A 05/30/2022   Procedure: SUCTION D&C;  Surgeon: Christeen Douglas, MD;  Location: [facility] ORS;  Service: Gynecology;  Laterality: N/A;   WISDOM TOOTH EXTRACTION     age 71; all four    Family History  Problem Relation Age of Onset   Depression Mother    Hyperlipidemia Father    Gout Father    Healthy Paternal Grandmother    Cancer Paternal Grandfather        Liver   Healthy Half-Brother     Social History   Socioeconomic History   Marital status: Married    Spouse name: Greig Castilla   Number of children: 0   Years of education: 16   Highest education level: Not on file  Occupational History   Occupation: activities Press photographer at retirement [employer]  Tobacco Use   Smoking status: Never   Smokeless tobacco: Never  Vaping Use   Vaping status: Never Used  Substance and Sexual Activity   Alcohol use: Not Currently    Comment: socially   Drug use: No   Sexual activity: Yes    Partners: Male    Birth control/protection: Pill  Other Topics Concern   Not on file  Social History Narrative   Not on file   Social Drivers of Health   Financial Resource Strain: Low Risk  (05/07/2023)   Overall Physicist, medical Strain (  CARDIA)    Difficulty of Paying Living Expenses: Not hard at all  Food Insecurity: No Food Insecurity (11/19/2023)   Hunger Vital Sign    Worried About Running Out of Food in the Last Year: Never true    Ran Out of Food in the Last Year: Never true  Transportation Needs: No Transportation Needs (11/19/2023)   PRAPARE - Administrator, Civil Service (Medical): No    Lack of Transportation (Non-Medical): No  Physical Activity: Sufficiently Active (05/07/2023)   Exercise Vital Sign    Days of Exercise per Week: 4 days    Minutes of Exercise per Session: 40 min  Stress: No Stress Concern Present (05/07/2023)   [employer] of Occupational Health - Occupational Stress Questionnaire    Feeling of Stress : Not at all  Social Connections: Socially  Integrated (11/19/2023)   Social Connection and Isolation Panel [NHANES]    Frequency of Communication with Friends and Family: More than three times a week    Frequency of Social Gatherings with Friends and Family: Twice a week    Attends Religious Services: More than 4 times per year    Active Member of Clubs or Organizations: Yes    Attends Banker Meetings: More than 4 times per year    Marital Status: Married    Immunization History  Administered Date(s) Administered   DTaP 12/16/1998, 02/15/1999, 12/29/2003, 06/28/2004   HIB (PRP-OMP) 12/16/1998, 02/15/1999   HPV Bivalent 12/08/2011, 02/09/2012, 06/21/2012   Hepatitis B 11/15/1998, 12/16/1998, 02/25/2004   IPV 12/16/1998, 02/15/1999, 12/29/2003   Influenza-Unspecified 08/30/2017, 07/22/2019   MMR 12/29/2003, 02/25/2004   Moderna Sars-Covid-2 Vaccination 11/19/2019, 12/17/2019, 11/08/2020   Rsv, Bivalent, Protein Subunit Rsvpref,pf Verdis Frederickson) 10/24/2023   Tdap 04/12/2010, 09/26/2023     PE:  Neck: no lymphadenopathy, no thyroid enlargement or nodules Breasts: soft, nt without masses, nodules, no lymphadenopathy Heart: S1S2 without murmur, rub or gallop Lung: CTA without wheezing, cough or sob Abdomen: Soft, non-tender GU: Pelvic exam: deferred.      ASSESSMENT/PLAN    Postpartum care and examination 1. Contraception:  POPs 2. Pap smear: repeat cervical cancer screening due 2027, normal in 2024 3. Postpartum depression/anxiety screening: EPDS 2 4. Infant feeding: Breast, good supply, no concerns 5. Follow up in: 1  year  or as needed.       No follow-ups on file.     Lindalou Hose Nell Schrack, CNM  02/24/251:39 PM

## 2023-12-31 NOTE — Assessment & Plan Note (Signed)
 1. Contraception:  POPs 2. Pap smear: repeat cervical cancer screening due 2027, normal in 2024 3. Postpartum depression/anxiety screening: EPDS 2 4. Infant feeding: Breast, good supply, no concerns 5. Follow up in: 1  year  or as needed.

## 2024-01-11 ENCOUNTER — Telehealth: Payer: Self-pay

## 2024-01-11 NOTE — Telephone Encounter (Signed)
 Patient states she believes she has a clogged milk duct  Assessment: -    Patient is postpartum, is Breastfeeding. denies vomiting, abdominal pain, fussiness, diarrhea, cough, and difficulty breathing Pain/redness of breast tissue , red streaks, swollen area, Tender lump with no other symptoms, and Breast fullness, firmness with pain and tenderness  ,    Fever >100.5 No  Allergies Verified Yes   Plan - (Clogged Milk Duct - tender lump in breast tissue with absence of any other symptoms) -   Continue to feed on baby's schedule., Ensure proper latching.  , Continue to feed on both breasts.  , Ice compress prior to feeding   , Massage above the affected area  , and Tylenol or Advil for discomfort.   Patient advised to call back if she has fever/flu like symptoms. Advised of after hours service available.
